# Patient Record
Sex: Female | Born: 1980 | Race: Black or African American | Hispanic: No | Marital: Single | Smoking: Former smoker
Health system: Southern US, Community
[De-identification: ages and names within clinical notes are randomized; demographics above are authoritative.]

## PROBLEM LIST (undated history)

## (undated) DIAGNOSIS — K5790 Diverticulosis of intestine, part unspecified, without perforation or abscess without bleeding: Secondary | ICD-10-CM

## (undated) DIAGNOSIS — O24419 Gestational diabetes mellitus in pregnancy, unspecified control: Secondary | ICD-10-CM

## (undated) DIAGNOSIS — Z8759 Personal history of other complications of pregnancy, childbirth and the puerperium: Secondary | ICD-10-CM

## (undated) HISTORY — DX: Gestational diabetes mellitus in pregnancy, unspecified control: O24.419

## (undated) HISTORY — PX: OTHER SURGICAL HISTORY: SHX169

## (undated) HISTORY — PX: CHOLECYSTECTOMY: SHX55

## (undated) HISTORY — DX: Diverticulosis of intestine, part unspecified, without perforation or abscess without bleeding: K57.90

## (undated) HISTORY — DX: Personal history of other complications of pregnancy, childbirth and the puerperium: Z87.59

---
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
## 2003-10-10 ENCOUNTER — Emergency Department (HOSPITAL_COMMUNITY): Admission: AD | Admit: 2003-10-10 | Discharge: 2003-10-10 | Payer: Self-pay | Admitting: Family Medicine

## 2004-12-14 ENCOUNTER — Emergency Department (HOSPITAL_COMMUNITY): Admission: EM | Admit: 2004-12-14 | Discharge: 2004-12-15 | Payer: Self-pay

## 2005-01-02 ENCOUNTER — Ambulatory Visit: Payer: Self-pay | Admitting: Internal Medicine

## 2005-10-08 ENCOUNTER — Ambulatory Visit: Payer: Self-pay | Admitting: Internal Medicine

## 2006-02-06 ENCOUNTER — Ambulatory Visit: Payer: Self-pay | Admitting: Internal Medicine

## 2006-03-09 ENCOUNTER — Inpatient Hospital Stay: Admission: AD | Admit: 2006-03-09 | Discharge: 2006-03-09 | Payer: Self-pay | Admitting: *Deleted

## 2006-05-16 ENCOUNTER — Ambulatory Visit (HOSPITAL_COMMUNITY): Admission: RE | Admit: 2006-05-16 | Discharge: 2006-05-16 | Payer: Self-pay | Admitting: Obstetrics

## 2006-05-18 ENCOUNTER — Emergency Department (HOSPITAL_COMMUNITY): Admission: EM | Admit: 2006-05-18 | Discharge: 2006-05-18 | Payer: Self-pay | Admitting: Family Medicine

## 2006-08-22 ENCOUNTER — Inpatient Hospital Stay (HOSPITAL_COMMUNITY): Admission: AD | Admit: 2006-08-22 | Discharge: 2006-08-24 | Payer: Self-pay | Admitting: Obstetrics & Gynecology

## 2006-08-26 ENCOUNTER — Inpatient Hospital Stay (HOSPITAL_COMMUNITY): Admission: AD | Admit: 2006-08-26 | Discharge: 2006-08-26 | Payer: Self-pay | Admitting: Obstetrics & Gynecology

## 2006-10-10 ENCOUNTER — Inpatient Hospital Stay (HOSPITAL_COMMUNITY): Admission: AD | Admit: 2006-10-10 | Discharge: 2006-10-13 | Payer: Self-pay | Admitting: Obstetrics

## 2006-10-11 ENCOUNTER — Encounter (INDEPENDENT_AMBULATORY_CARE_PROVIDER_SITE_OTHER): Payer: Self-pay | Admitting: Specialist

## 2007-09-23 ENCOUNTER — Ambulatory Visit (HOSPITAL_COMMUNITY): Admission: RE | Admit: 2007-09-23 | Discharge: 2007-09-23 | Payer: Self-pay | Admitting: Obstetrics & Gynecology

## 2007-12-09 ENCOUNTER — Inpatient Hospital Stay (HOSPITAL_COMMUNITY): Admission: AD | Admit: 2007-12-09 | Discharge: 2007-12-09 | Payer: Self-pay | Admitting: Obstetrics

## 2007-12-19 ENCOUNTER — Ambulatory Visit (HOSPITAL_COMMUNITY): Admission: RE | Admit: 2007-12-19 | Discharge: 2007-12-19 | Payer: Self-pay | Admitting: Obstetrics & Gynecology

## 2008-01-30 ENCOUNTER — Inpatient Hospital Stay (HOSPITAL_COMMUNITY): Admission: AD | Admit: 2008-01-30 | Discharge: 2008-01-30 | Payer: Self-pay | Admitting: Obstetrics & Gynecology

## 2008-02-16 ENCOUNTER — Inpatient Hospital Stay (HOSPITAL_COMMUNITY): Admission: AD | Admit: 2008-02-16 | Discharge: 2008-02-18 | Payer: Self-pay | Admitting: Obstetrics

## 2008-11-07 ENCOUNTER — Inpatient Hospital Stay (HOSPITAL_COMMUNITY): Admission: EM | Admit: 2008-11-07 | Discharge: 2008-11-10 | Payer: Self-pay | Admitting: Family Medicine

## 2008-11-09 ENCOUNTER — Encounter (INDEPENDENT_AMBULATORY_CARE_PROVIDER_SITE_OTHER): Payer: Self-pay | Admitting: General Surgery

## 2009-02-02 ENCOUNTER — Emergency Department (HOSPITAL_COMMUNITY): Admission: EM | Admit: 2009-02-02 | Discharge: 2009-02-02 | Payer: Self-pay | Admitting: Emergency Medicine

## 2010-05-07 ENCOUNTER — Emergency Department (HOSPITAL_COMMUNITY): Admission: EM | Admit: 2010-05-07 | Discharge: 2010-05-07 | Payer: Self-pay | Admitting: Emergency Medicine

## 2010-05-09 ENCOUNTER — Emergency Department (HOSPITAL_COMMUNITY): Admission: EM | Admit: 2010-05-09 | Discharge: 2010-05-09 | Payer: Self-pay | Admitting: Family Medicine

## 2010-09-20 ENCOUNTER — Emergency Department (HOSPITAL_COMMUNITY): Admission: EM | Admit: 2010-09-20 | Discharge: 2010-09-20 | Payer: Self-pay | Admitting: Emergency Medicine

## 2011-02-07 LAB — POCT PREGNANCY, URINE: Preg Test, Ur: NEGATIVE

## 2011-04-10 NOTE — Consult Note (Signed)
Diane, Snyder            ACCOUNT NO.:  [id number]   MEDICAL RECORD NO.:  [id number]          PATIENT TYPE:  INP   LOCATION:  5125                         FACILITY:  [facility]   PHYSICIAN:  James L. Randa Evens, M.D. DATE OF BIRTH:  1981/10/24   DATE OF CONSULTATION:  Snyder  DATE OF DISCHARGE:                                 CONSULTATION   We were asked to see Diane Snyder today in consultation for possibly  choledocholithiasis along with her pancreatitis.   HISTORY OF PRESENT ILLNESS:  This is a very pleasant 30 year old African  American female with a 86-month history of intermittent epigastric pain  that became severe yesterday and migrated to her left upper quadrant.  She describes no vomiting, no fever, occasional constipation.  She was  nauseated last night.  She required pain medications in the emergency  room but has had none since.  She tells me that she has 2 children, one  26-month-old and one 62-year-old at home.   Past medical history is significant only for hypertension with her  pregnancy.   She has no known drug allergies.   She is not on any medications at home.   Review of systems is significant for no fever, no headache, no recent  illness, no vomiting.   Social history is negative for drugs, alcohol, and tobacco.   On physical exam, she is alert and oriented, sitting up, bathing  herself.  Temperature is 98.3, pulse 82, respirations 20, blood pressure  is 106/59.  Heart has a regular rate and rhythm with no murmurs, rubs,  or gallops appreciated.  Her lungs are clear to auscultation.  Her  abdomen is soft, nontender, nondistended with good bowel sounds.   LABORATORY DATA:  Her hemoglobin has come down with hydration, it is  currently 11.3, hematocrit 34.4, white count 5.6, platelets 217,000.  Her BMET is within normal limits other than her potassium of 3.4.  LFTs  are as follows:  Today, AST 47, ALT 60, alk phos 104, total bilirubin  1.2; yesterday on  admission, her AST was 97, ALT 82, alk phos 105, total  bilirubin 2.8 today.  Today, her amylase is 688, lipase 586; yesterday,  her lipase was elevated at 2924.   Radiological exams done yesterday include an ultrasound of her abdomen.  Her gallbladder was contracted and full of stones.  Her common bile duct  was 3.5 mm in diameter within normal limits.   ASSESSMENT:  Dr. Carman Ching has seen and examined the patient,  collected her history and reviewed her chart.  His impression is that  this is a 30 year old female with gallstone pancreatitis.  No obvious  choledocholithiasis.  The patient was not requiring pain medications  overnight.  Her LFTs, amylase, and lipase have  trended down nicely.  No ERCP MRCP is planned at this time.  We will  follow with you.  Per [facility], the patient will possibly have a  cholecystectomy on November 09, 2008.   Thanks very much for this consultation.      Stephani Police, PA    ______________________________  Llana Aliment Randa Evens, M.D.  MLY/MEDQ  Diane Snyder  T:  Snyder  Job:  [id number]

## 2011-04-10 NOTE — Op Note (Signed)
[name], [name]            ACCOUNT NO.:  [id number]   MEDICAL RECORD NO.:  [id number]          PATIENT TYPE:  INP   LOCATION:  5125                         FACILITY:  [facility]   PHYSICIAN:  Anselm Pancoast. Weatherly, M.D.DATE OF BIRTH:  07-15-1981   DATE OF PROCEDURE:  11/09/2008  DATE OF DISCHARGE:                               OPERATIVE REPORT   PREOPERATIVE DIAGNOSIS:  Gallstones with a history of resolving  gallstone pancreatitis.   OPERATION:  Laparoscopic cholecystectomy with cholangiogram.   ANESTHESIA:  General anesthesia.   SURGEON:  Anselm Pancoast. Zachery Dakins, MD   ASSISTANT:  Dr. Rochel Brome. Tresa Endo.   HISTORY OF PRESENT ILLNESS:  Diane Snyder is a 30 year old female  who is about 6 months postpartum who was admitted through the emergency  room yesterday morning, earlier or late Sunday evening with pain in the  right upper quadrant.  The pain had presented and then gave her pain in  the left upper quadrant.  She has two children, two years of age and 71  months, and she is on birth control pills.  She was seen by the ER  physicians, and a gallbladder that was contracted full of stones was  noted.  Her common bile duct was slightly prominent at 3.5 mm, and her  amylase was significantly elevated up 2224.  She was admitted by Dr.  Janee Morn, placed on antibiotics.  We saw her the following morning, she  was clinically improved, and the amylase had come down to 500.  Her  amylase is down to normal this morning.  She is not having any  significant pain in the right or left upper quadrants, and I recommended  we proceed on with a laparoscopic cholecystectomy and cholangiogram and  she was in agreement.  She had been seen by Dr. Carman Ching of GI when  she had such a markedly amylase initially, and he was in agreement that  it is certainly seemed that she had passed the common duct stone.  The  patient is on Primaxin and no additional antibiotics were given.   DESCRIPTION FOR  PROCEDURE:  She was then taken to the OR suite, inducted  general anesthesia, endotracheal tube, oral tube into the stomach; and  the abdomen was prepped with Betadine solution and draped in a sterile  manner.  Her abdomen was fairly soft where she has been recently  pregnant, and a small incision was made below the umbilicus.  The fascia  identified, picked up between two Kocher's, and a small opening  carefully made into the peritoneal cavity.  The pursestring suture of 0  Vicryl was placed with Hasson cannula and the gallbladder was full of  stones, not acutely inflamed, but certainly not decompressed since there  were so many stones.   An upper 10-mm trocar was placed under direct vision after anesthetizing  the fascia and two lateral 5 mm trocars were placed.  I could see the  junction of the cystic duct of the gallbladder, and then I could see the  cystic artery.  I encompassed the cystic artery at right angle first,  placed one clip, then encompassed  the cystic bile duct at the  gallbladder junction, and placed a clip here.  I then freed up the  pneumoperitoneum over the more proximal cystic duct slightly, so I could  make a little opening.  Then, a Cook catheter was introduced, held in  place with a clip, and a cholangiogram was obtained.  It showed good  prompt filling of the extrahepatic biliary system, good flow into the  duodenum.  No evidence of a common duct stone.  It appears that it is  about 3 cm cystic duct.   I then removed the catheter strip.  I placed three clips about a  centimeter closer to the cystic bile duct junction and then divided the  cystic duct, then put two additional clips along the cystic artery and  divided it distal to the two clips.  Then, the gallbladder which was on  the mesentery was freed up, placed a clip across the area and there was  a posterior branch and then freed the gallbladder from its bed.  There  was good hemostasis.  The gallbladder  was placed in EndoCatch bag, and  then we looked down at the gallbladder fossa.  We were pleased with the  hemostasis.  No bile and no bleeding, and then switched the camera to  the upper 2 mm port and grabbed the sac containing the gallbladder and  it came out easily.   There were many stones in the gallbladder.  I then put an additional  figure-of-eight Vicryl in the fascia at the umbilicus.  A single Vicryl  suture of 0 at the subxiphoid area and then closed the subcutaneous  wounds with 4-0 Vicryl.  Benzoin and Steri-Strips on the skin after the  CO2 had been released.  The patient tolerated the procedure nicely.  We  will advance her to a regular diet over the next 12 hours, and should be  able to discharge in the morning.           ______________________________  Anselm Pancoast. Zachery Dakins, M.D.     WJW/MEDQ  D:  11/09/2008  T:  11/10/2008  Job:  [id number]

## 2011-04-10 NOTE — H&P (Signed)
Diane Snyder, GRIMLEY            ACCOUNT NO.:  [id number]   MEDICAL RECORD NO.:  [id number]          PATIENT TYPE:  INP   LOCATION:  5125                         FACILITY:  [facility]   PHYSICIAN:  Gabrielle Dare. Janee Morn, M.D.DATE OF BIRTH:  [birth year]   DATE OF ADMISSION:  11/07/2008  DATE OF DISCHARGE:                              HISTORY & PHYSICAL   CHIEF COMPLAINT:  Epigastric abdominal pain.   HISTORY OF PRESENT ILLNESS:  Ms. Pollet is a 30 year old African  American female who is otherwise healthy, who complains of a 43-month  history of intermittent epigastric abdominal pain.  It developed again  around 4 a.m. yesterday, and has gradually worsened.  It extended over  and towards the left upper quadrant and then down posteriorly.  She had  some associated nausea, but no significant vomiting.  She went to an  urgent care and was then evaluated at [facility] by  Dr. Freida Busman.   Laboratory studies were consistent with pancreatitis and mild  transaminitis and a hyperbilirubinemia.  Ultrasound demonstrates a  gallbladder, which is contracted and full of stones, bile duct was non-  dilated.  It is 3.5 sq .mm.  I have to evaluate for admission and  treatment.   PAST MEDICAL HISTORY:  Hypertension associated with pregnancy, which has  now resolved.  She has 2 children, one aged 2, and one 9 months.   CURRENT MEDICATIONS:  None.   ALLERGIES:  No known drug allergies.   FAMILY HISTORY:  She describes diabetes and hypertension in her parents.   REVIEW OF SYSTEMS:  GASTROINTESTINAL:  As above.  CONSTITUTIONAL:  Negative.  CARDIAC:  Negative.  PULMONARY:  Negative.  GENITOURINARY:  Negative.  MUSCULOSKELETAL:  Negative.  The remainder of the review of  systems was unremarkable.   PHYSICAL EXAMINATION:  VITAL SIGNS:  Today, temperature 97.6, pulse 76,  respirations 20, and blood pressure 113/64.  GENERAL:  She is awake and alert.  She appears her stated age.  She is  in no distress.  HEENT:  Pupils are equal and reactive.  Nares are patent and clear.  Oral mucosa is moist but she does have poor dentition.  NECK:  Supple.  No tenderness or masses.  PULMONARY:  Lungs are clear to auscultation.  No wheezing.  Respiratory  effort is good.  CARDIAC:  Heart is regular but no murmurs.  Pulse is palpable on the  left chest.  Distal pulses are 2+ throughout with no peripheral edema.  ABDOMEN:  Soft, but she does have some mild tenderness in the  epigastrium and extending over to the left upper quadrant.  Bowel sounds  are present.  No masses or organomegaly are felt on palpation.  SKIN:  Warm and dry.  No rashes are noted.  EXTREMITIES:  Demonstrates no deformity or tenderness.   Ultrasound results are as above.   LABORATORY STUDIES:  White blood cell count 8.6, hemoglobin 13.1,  platelets 233.  AST 97, ALT 82, alkaline phosphatase 105, bilirubin 2.8.  Sodium 138, potassium 3.7, chloride 106, CO2 of 24.  BUN 6, creatinine  0.66, glucose 121, lipase 2924.  IMPRESSION:  Biliary pancreatitis.   PLAN:  1. We will admit her and place her on IV fluids and IV antibiotics.  2. We will obtain Gastrointestinal consultation and I spoke with Dr.      Charlott Rakes from [facility].  3. We will also plan laparoscopic cholecystectomy once her      pancreatitis has resolved.  4. The plan was discussed in detail with the patient and questions      were answered.      Gabrielle Dare Janee Morn, M.D.  Electronically Signed     BET/MEDQ  D:  11/07/2008  T:  11/08/2008  Job:  [id number]   cc:   Shirley Friar, MD

## 2011-04-13 NOTE — Discharge Summary (Signed)
[name], [name]            ACCOUNT NO.:  [id number]   MEDICAL RECORD NO.:  [id number]          PATIENT TYPE:  INP   LOCATION:  5125                         FACILITY:  [facility]   PHYSICIAN:  Anselm Pancoast. Weatherly, M.D.DATE OF BIRTH:  23-Mar-1981   DATE OF ADMISSION:  11/07/2008  DATE OF DISCHARGE:  11/10/2008                               DISCHARGE SUMMARY   ADMITTING PHYSICIAN:  Gabrielle Dare. Janee Morn, MD   DISCHARGING PHYSICIAN:  Anselm Pancoast. Zachery Dakins, MD   CHIEF COMPLAINT AND REASON FOR ADMISSION:  Diane Snyder is a 30 year old  female patient, developed abdominal pain progressively over the past 2  months, increased by 4 a.m. on the day prior to admission, always  epigastric located.  This pain was reproducible in the ER.  The patient  on presentation had a white count of 8600, AST 97, ALT 82, alkaline  phosphatase 105, bilirubin 2.8, lipase 2924.  The patient had an  ultrasound done that revealed a gallbladder contracted and full of  stones.  Common bile duct nondilated at 3.5 mm.  The patient was  admitted with a diagnosis of biliary pancreatitis.   HOSPITAL COURSE:  The patient was admitted, placed on bowel rest, IV  fluids.  The patient had significant decrease in her LFTs as well as  pancreatic enzymes by the first hospital day with amylase down to 688,  lipase down to 506.  She continued with mild epigastric and left upper  quadrant tenderness.  Because of the suspicion for possible  choledocholithiasis in the setting of biliary pancreatitis and  transaminitis, GI consult was obtained.  There was no indication to have  an ERCP preoperatively and by November 09, 2008, the patient's lipase  was down to 82 and she was deemed appropriate to undergo surgery.  She  underwent a laparoscopic cholecystectomy with normal cholangiogram on  November 09, 2008 by Dr. Zachery Dakins.  By postop day #1, the patient was  doing well.  She had incisional tenderness, but was stable.  Bowel  sounds  were present.  She was tolerating liquid diet with advancement in  her pain medications to of oral.  Toradol was added and she was given  intake for discharge home.  Incisions were clean, dry and intact and she  was otherwise deemed appropriate discharge home.   FINAL DISCHARGE DIAGNOSES:  1. Abdominal pain secondary to biliary pancreatitis.  2. Status post laparoscopic cholecystectomy with normal intraoperative      cholangiogram.   DISCHARGE MEDICATIONS:  1. Vicodin 5/325 one to two tablets every 4 hours as needed for pain.  2. Ibuprofen 600 mg 3 times daily as needed for pain.  Always take      this with food or snacks.  May take in addition MiraLax.  Wound      care allow Steri-Strips to fall off.  Ice pack to wound.  To      prevent pain, remove bandages in the morning.  Return to work in 2      weeks note given.   DIET:  Low fat.   ACTIVITY:  Increase activity slowly.  May walk up steps.  No lifting  greater than 15 pounds for 2 weeks.  No driving for 1 week.   ADDITIONAL INSTRUCTIONS:  Call physician if fever greater than 101.5  degrees Fahrenheit, new or increased belly pain, redness, or drainage  from the wound, nausea, vomiting, diarrhea, or constipation.      Allison L. Rennis Harding, N.P.    ______________________________  Anselm Pancoast. Zachery Dakins, M.D.    ALE/MEDQ  D:  12/16/2008  T:  12/17/2008  Job:  [id number]

## 2011-04-13 NOTE — Discharge Summary (Signed)
Diane Snyder, Diane Snyder            ACCOUNT NO.:  [id number]   MEDICAL RECORD NO.:  [id number]          PATIENT TYPE:  INP   LOCATION:  9153                          FACILITY:  [facility]   PHYSICIAN:  Charles A. Clearance Coots, M.D.DATE OF BIRTH:  1981-08-19   DATE OF ADMISSION:  08/22/2006  DATE OF DISCHARGE:  08/24/2006                                 DISCHARGE SUMMARY   ADMITTING DIAGNOSES:  1. Thirty-one weeks' gestation.  2. Preterm uterine contractions.  3. Probable cervicitis.   DISCHARGE DIAGNOSES:  1. Thirty-one weeks' gestation, much improved after bed rest and      intravenous antibiotic therapy.  2. Preterm uterine contractions.  3. Probable cervicitis.   DISPOSITION:  Discharged home, undelivered, at 64 weeks' gestation in good  condition.   REASON FOR ADMISSION:  A 30 year old, black female, G1 presented with  complaint of spotting.  She said it felt like the baby was balling up.  She  was examined and found to have findings significant for probable cervicitis.  Cervix is closed, but was 50% effaced.  The patient was therefore admitted  for IV antibiotic therapy and fetal monitor.   PAST MEDICAL HISTORY:  Surgery none.  Illnesses none.   MEDICATIONS:  Prenatal vitamins   ALLERGIES:  NO KNOWN DRUG ALLERGIES.   SOCIAL HISTORY:  Single.  Negative for tobacco, alcohol or recreational drug  use.   FAMILY HISTORY:  Positive for arthritis, asthma, COPD, diabetes, glaucoma  and hypertension.   PHYSICAL EXAMINATION:  GENERAL:  Well-nourished, well-developed female in no  acute distress.  VITAL SIGNS:  Temperature 98.3, pulse 105, respiratory rate 20, blood  pressure 120/77.  HEENT:  Within normal limits.  LUNGS:  Clear to auscultation bilaterally.  HEART:  Regular rate and rhythm.  ABDOMEN:  Gravid, nontender.  PELVIS:  On speculum exam, there was a small amount of heme in the vaginal  vault.  No active bleeding from the cervix.   LABORATORY DATA AND X-RAY FINDINGS:   Wet-prep revealed a few yeast, a few  clue cells, a few white blood cells.  Urinalysis revealed specific gravity  of 1.015, large heme and small  leukocyte esterase.  Hemoglobin 10.9,  hematocrit 32.5, white blood cell count 9600, platelets 214,000.  GC and  chlamydia cultures were negative.   HOSPITAL COURSE:  The patient was admitted and started on IV fluid hydration  and IV antibiotic therapy.  She responded well to therapy and initially had  increased uterine activity and was given subcu terbutaline with good  response.  She had no further uterine activity and was discharged home on  hospital day #2, undelivered at 41 weeks' gestation in good condition.   DISCHARGE MEDICATIONS:  1. Clindamycin 300 mg p.o. twice a day.  2. Continue prenatal vitamins.   SPECIAL INSTRUCTIONS:  Routine written instructions were given for discharge  and preterm labor precautions.  The patient is to follow up in the office in  1 week.      Charles A. Clearance Coots, M.D.  Electronically Signed     CAH/MEDQ  D:  08/24/2006  T:  08/26/2006  Job:  [id number]

## 2011-07-24 ENCOUNTER — Inpatient Hospital Stay (INDEPENDENT_AMBULATORY_CARE_PROVIDER_SITE_OTHER)
Admission: RE | Admit: 2011-07-24 | Discharge: 2011-07-24 | Disposition: A | Discharge status: Home / Self Care | Payer: Medicaid Other | Source: Ambulatory Visit | Attending: Emergency Medicine | Admitting: Emergency Medicine

## 2011-07-24 DIAGNOSIS — G43009 Migraine without aura, not intractable, without status migrainosus: Secondary | ICD-10-CM

## 2011-08-16 LAB — URINALYSIS, ROUTINE W REFLEX MICROSCOPIC
Hgb urine dipstick: NEGATIVE
Ketones, ur: NEGATIVE
Nitrite: NEGATIVE
Protein, ur: NEGATIVE
pH: 6.5

## 2011-08-16 LAB — URINE MICROSCOPIC-ADD ON

## 2011-08-20 LAB — URINALYSIS, ROUTINE W REFLEX MICROSCOPIC
Bilirubin Urine: NEGATIVE
Hgb urine dipstick: NEGATIVE
Ketones, ur: NEGATIVE
Nitrite: NEGATIVE
Protein, ur: NEGATIVE
Urobilinogen, UA: 0.2

## 2011-08-20 LAB — URINE MICROSCOPIC-ADD ON

## 2011-08-20 LAB — CBC
HCT: 37.8
MCHC: 32.8
MCHC: 33.5
MCV: 81.3
Platelets: 168
RBC: 3.86 — ABNORMAL LOW
RBC: 4.61
RDW: 15.1

## 2011-08-20 LAB — WET PREP, GENITAL: Yeast Wet Prep HPF POC: NONE SEEN

## 2011-08-31 LAB — COMPREHENSIVE METABOLIC PANEL
ALT: 82 U/L — ABNORMAL HIGH (ref 0–35)
Albumin: 2.9 g/dL — ABNORMAL LOW (ref 3.5–5.2)
Albumin: 3 g/dL — ABNORMAL LOW (ref 3.5–5.2)
Albumin: 3.8 g/dL (ref 3.5–5.2)
Alkaline Phosphatase: 104 U/L (ref 39–117)
Alkaline Phosphatase: 105 U/L (ref 39–117)
Alkaline Phosphatase: 90 U/L (ref 39–117)
BUN: 3 mg/dL — ABNORMAL LOW (ref 6–23)
BUN: 6 mg/dL (ref 6–23)
CO2: 25 mEq/L (ref 19–32)
Calcium: 8.4 mg/dL (ref 8.4–10.5)
Chloride: 108 mEq/L (ref 96–112)
Creatinine, Ser: 0.66 mg/dL (ref 0.4–1.2)
Creatinine, Ser: 0.66 mg/dL (ref 0.4–1.2)
Creatinine, Ser: 0.71 mg/dL (ref 0.4–1.2)
GFR calc Af Amer: >60 mL/min (ref >60)
GFR calc non Af Amer: >60 mL/min (ref >60)
GFR calc non Af Amer: >60 mL/min (ref >60)
Glucose, Bld: 75 mg/dL (ref 70–99)
Potassium: 3.4 mEq/L — ABNORMAL LOW (ref 3.5–5.1)
Potassium: 3.7 mEq/L (ref 3.5–5.1)
Potassium: 3.7 mEq/L (ref 3.5–5.1)
Sodium: 138 mEq/L (ref 135–145)
Total Bilirubin: 0.8 mg/dL (ref 0.3–1.2)
Total Protein: 5.9 g/dL — ABNORMAL LOW (ref 6.0–8.3)
Total Protein: 7.5 g/dL (ref 6.0–8.3)

## 2011-08-31 LAB — CBC
HCT: 33.9 % — ABNORMAL LOW (ref 36.0–46.0)
HCT: 34.4 % — ABNORMAL LOW (ref 36.0–46.0)
Hemoglobin: 11.3 g/dL — ABNORMAL LOW (ref 12.0–15.0)
Hemoglobin: 11.4 g/dL — ABNORMAL LOW (ref 12.0–15.0)
MCHC: 31.9 g/dL (ref 30.0–36.0)
MCHC: 33 g/dL (ref 30.0–36.0)
MCV: 78.9 fL (ref 78.0–100.0)
MCV: 79.8 fL (ref 78.0–100.0)
MCV: 80 fL (ref 78.0–100.0)
Platelets: 217 10*3/uL (ref 150–400)
Platelets: 222 10*3/uL (ref 150–400)
Platelets: 233 10*3/uL (ref 150–400)
RBC: 4.29 MIL/uL (ref 3.87–5.11)
RDW: 13.8 % (ref 11.5–15.5)
WBC: 5 10*3/uL (ref 4.0–10.5)
WBC: 8.6 10*3/uL (ref 4.0–10.5)

## 2011-08-31 LAB — URINALYSIS, ROUTINE W REFLEX MICROSCOPIC
Glucose, UA: NEGATIVE mg/dL
Hgb urine dipstick: NEGATIVE
Nitrite: NEGATIVE
Protein, ur: 30 mg/dL — AB
Specific Gravity, Urine: 1.027 (ref 1.005–1.030)

## 2011-08-31 LAB — LIPASE, BLOOD: Lipase: 586 U/L — ABNORMAL HIGH (ref 11–59)

## 2011-08-31 LAB — DIFFERENTIAL
Band Neutrophils: 0 % (ref 0–10)
Basophils Absolute: 0 10*3/uL (ref 0.0–0.1)
Blasts: 0 %
Eosinophils Relative: 2 % (ref 0–5)
Lymphocytes Relative: 17 % (ref 12–46)
Lymphs Abs: 1.5 10*3/uL (ref 0.7–4.0)
Metamyelocytes Relative: 0 %
Monocytes Absolute: 0.3 10*3/uL (ref 0.1–1.0)
Neutrophils Relative %: 78 % — ABNORMAL HIGH (ref 43–77)
Promyelocytes Absolute: 0 %
nRBC: 0 /100 WBC

## 2011-08-31 LAB — URINE MICROSCOPIC-ADD ON

## 2011-08-31 LAB — URINE CULTURE: Colony Count: 6000

## 2011-08-31 LAB — AMYLASE: Amylase: 688 U/L — ABNORMAL HIGH (ref 27–131)

## 2014-04-12 ENCOUNTER — Ambulatory Visit: Payer: 59 | Admitting: Obstetrics

## 2014-05-25 ENCOUNTER — Encounter: Payer: Self-pay | Admitting: Obstetrics

## 2014-05-25 ENCOUNTER — Ambulatory Visit (INDEPENDENT_AMBULATORY_CARE_PROVIDER_SITE_OTHER): Payer: 59 | Admitting: Obstetrics

## 2014-05-25 VITALS — BP 122/77 | HR 88 | Temp 98.7°F | Ht 66.0 in | Wt 191.0 lb

## 2014-05-25 DIAGNOSIS — Z3202 Encounter for pregnancy test, result negative: Secondary | ICD-10-CM

## 2014-05-25 DIAGNOSIS — Z30431 Encounter for routine checking of intrauterine contraceptive device: Secondary | ICD-10-CM

## 2014-05-25 DIAGNOSIS — Z30433 Encounter for removal and reinsertion of intrauterine contraceptive device: Secondary | ICD-10-CM

## 2014-05-26 ENCOUNTER — Encounter: Payer: Self-pay | Admitting: Obstetrics

## 2014-05-26 LAB — GC/CHLAMYDIA PROBE AMP
CT PROBE, AMP APTIMA: NEGATIVE
GC Probe RNA: NEGATIVE

## 2014-05-26 LAB — WET PREP BY MOLECULAR PROBE
Candida species: POSITIVE — AB
GARDNERELLA VAGINALIS: NEGATIVE
TRICHOMONAS VAG: NEGATIVE

## 2014-05-26 NOTE — Progress Notes (Signed)
IUD Insertion Procedure Note  Pre-operative Diagnosis: Desire Contraception  Post-operative Diagnosis: same  Indications: contraception  Procedure Details  Urine pregnancy test was done in office and result was negative.  The risks (including infection, bleeding, pain, and uterine perforation) and benefits of the procedure were explained to the patient and Written informed consent was obtained.    Cervix cleansed with Betadine. Uterus sounded to 6 cm. IUD inserted without difficulty. String visible and trimmed. Patient tolerated procedure well.  IUD Information: Mirena, Lot # [id number], Expiration date 08 / 17.  Condition: Stable  Complications: None  Plan:  The patient was advised to call for any fever or for prolonged or severe pain or bleeding. She was advised to use NSAID as needed for mild to moderate pain.   Attending Physician Documentation: I was present for or participated in the entire procedure, including opening and closing.

## 2014-05-26 NOTE — Progress Notes (Signed)
Subjective:    Diane Snyder is a 33 y.o. female who presents for contraception counseling. The patient has no complaints today. The patient is sexually active. Pertinent past medical history: none.  The information documented in the HPI was reviewed and verified.  Menstrual History: OB History   Grav Para Term Preterm Abortions TAB SAB Ect Mult Living   2 2 2       2       No LMP recorded. Patient is not currently having periods (Reason: IUD).   There are no active problems to display for this patient.  History reviewed. No pertinent past medical history.  Past Surgical History  Procedure Laterality Date  . Cholestectomy       No current outpatient prescriptions on file. No Known Allergies  History  Substance Use Topics  . Smoking status: Former Research scientist (life sciences)  . Smokeless tobacco: Never Used  . Alcohol Use: Yes    Family History  Problem Relation Age of Onset  . Hypertension Mother        Review of Systems Constitutional: negative for weight loss Genitourinary:negative for abnormal menstrual periods and vaginal discharge   Objective:   BP 122/77  Pulse 88  Temp(Src) 98.7 F (37.1 C)  Ht 5\' 6"  (1.676 m)  Wt 191 lb (86.637 kg)  BMI 30.84 kg/m2   General:   alert  Skin:   IUD string   no rash or abnormalities  Lungs:   clear to auscultation bilaterally  Heart:   regular rate and rhythm, S1, S2 normal, no murmur, click, rub or gallop  Breasts:   normal without suspicious masses, skin or nipple changes or axillary nodes  Abdomen:  normal findings: no organomegaly, soft, non-tender and no hernia  Pelvis:  External genitalia: normal general appearance Urinary system: urethral meatus normal and bladder without fullness, nontender Vaginal: normal without tenderness, induration or masses Cervix: normal appearance.  IUD string visible and grasped with dressing forcep and IUD removed, intact. Adnexa: normal bimanual exam Uterus: anteverted and non-tender, normal size    Lab Review Urine pregnancy test is negative Labs reviewed yes Radiologic studies reviewed no    Assessment:    33 y.o., continuing IUD, no contraindications.   Plan:    All questions answered. Discussed healthy lifestyle modifications. Follow up in 6 weeks. Pregnancy test, result: negative. Wet prep. GC / [place] cultures done  No orders of the defined types were placed in this encounter.   Orders Placed This Encounter  Procedures  . WET PREP BY MOLECULAR PROBE  . GC/Chlamydia Probe Amp

## 2014-05-31 LAB — POCT URINE PREGNANCY: Preg Test, Ur: NEGATIVE

## 2014-05-31 NOTE — Addendum Note (Signed)
Addended by: Ladona Ridgel on: 05/31/2014 10:27 AM   Modules accepted: Orders

## 2014-06-11 ENCOUNTER — Other Ambulatory Visit: Payer: Self-pay | Admitting: *Deleted

## 2014-06-11 DIAGNOSIS — B373 Candidiasis of vulva and vagina: Secondary | ICD-10-CM

## 2014-06-11 DIAGNOSIS — B3731 Acute candidiasis of vulva and vagina: Secondary | ICD-10-CM

## 2014-06-11 MED ORDER — FLUCONAZOLE 150 MG PO TABS: 150.0000 mg | ORAL_TABLET | Freq: Every day | ORAL | Status: DC

## 2014-07-06 ENCOUNTER — Ambulatory Visit: Payer: 59 | Admitting: Obstetrics

## 2014-09-27 ENCOUNTER — Encounter: Payer: Self-pay | Admitting: Obstetrics

## 2016-05-08 ENCOUNTER — Encounter (HOSPITAL_COMMUNITY): Payer: Self-pay | Admitting: Emergency Medicine

## 2016-05-08 ENCOUNTER — Ambulatory Visit (INDEPENDENT_AMBULATORY_CARE_PROVIDER_SITE_OTHER): Payer: BC Managed Care – PPO

## 2016-05-08 ENCOUNTER — Ambulatory Visit (HOSPITAL_COMMUNITY)
Admission: EM | Admit: 2016-05-08 | Discharge: 2016-05-08 | Disposition: A | Discharge status: Home / Self Care | Payer: BC Managed Care – PPO | Attending: Family Medicine | Admitting: Family Medicine

## 2016-05-08 ENCOUNTER — Ambulatory Visit (HOSPITAL_COMMUNITY): Payer: BC Managed Care – PPO

## 2016-05-08 DIAGNOSIS — M940 Chondrocostal junction syndrome [Tietze]: Secondary | ICD-10-CM | POA: Insufficient documentation

## 2016-05-08 DIAGNOSIS — Z87891 Personal history of nicotine dependence: Secondary | ICD-10-CM | POA: Diagnosis not present

## 2016-05-08 DIAGNOSIS — R079 Chest pain, unspecified: Secondary | ICD-10-CM | POA: Diagnosis present

## 2016-05-08 MED ORDER — NAPROXEN SODIUM 550 MG PO TABS: 550.0000 mg | ORAL_TABLET | Freq: Two times a day (BID) | ORAL | Status: DC

## 2016-05-08 NOTE — Discharge Instructions (Signed)
Costochondritis  Costochondritis is a condition in which the tissue (cartilage) that connects your ribs with your breastbone (sternum) becomes irritated. It causes pain in the chest and rib area. It usually goes away on its own over time.  HOME CARE  · Avoid activities that wear you out.  · Do not strain your ribs. Avoid activities that use your:    Chest.    Belly.    Side muscles.  · Put ice on the area for the first 2 days after the pain starts.    Put ice in a plastic bag.    Place a towel between your skin and the bag.    Leave the ice on for 20 minutes, 2-3 times a day.  · Only take medicine as told by your doctor.  GET HELP IF:  · You have redness or puffiness (swelling) in the rib area.  · Your pain does not go away with rest or medicine.  GET HELP RIGHT AWAY IF:   · Your pain gets worse.  · You are very uncomfortable.  · You have trouble breathing.  · You cough up blood.  · You start sweating or throwing up (vomiting).  · You have a fever or lasting symptoms for more than 2-3 days.  · You have a fever and your symptoms suddenly get worse.  MAKE SURE YOU:   · Understand these instructions.  · Will watch your condition.  · Will get help right away if you are not doing well or get worse.     This information is not intended to replace advice given to you by your health care provider. Make sure you discuss any questions you have with your health care provider.     Document Released: 04/30/2008 Document Revised: 07/15/2013 Document Reviewed: 06/16/2013  Elsevier Interactive Patient Education ©2016 [employer]

## 2016-05-08 NOTE — ED Notes (Signed)
Patient going to radiology at [facility].  Spoke to [place] in radiology making them aware of patient coming

## 2016-05-08 NOTE — ED Provider Notes (Signed)
CSN: [id number]     Arrival date & time 05/08/16  1435 History   First MD Initiated Contact with Patient 05/08/16 1537     Chief Complaint  Patient presents with  . Chest Pain   (Consider location/radiation/quality/duration/timing/severity/associated sxs/prior Treatment) HPI History obtained from patient: Location: midchest  Context/Duration: 1 week, sudden onset of pain with movement and deep breathing.   Severity: 2  Quality:sharp shooting Timing:           episodic Home Treatment: tylenol, rest Associated symptoms:  Some shortness of breath if carrying heavy bags of trash Family History:HTN-mother    History reviewed. No pertinent past medical history. Past Surgical History  Procedure Laterality Date  . Cholestectomy     . Cholecystectomy     Family History  Problem Relation Age of Onset  . Hypertension Mother    Social History  Substance Use Topics  . Smoking status: Former Research scientist (life sciences)  . Smokeless tobacco: Never Used  . Alcohol Use: Yes   OB History    Gravida Para Term Preterm AB TAB SAB Ectopic Multiple Living   2 2 2       2      Review of Systems  Denies: HEADACHE, NAUSEA, ABDOMINAL PAIN,  CONGESTION, DYSURIA, SHORTNESS OF BREATH  Allergies  Review of patient's allergies indicates no known allergies.  Home Medications   Prior to Admission medications   Medication Sig Start Date End Date Taking? Authorizing Provider  ibuprofen (ADVIL,MOTRIN) 200 MG tablet Take 200 mg by mouth every 6 (six) hours as needed.   Yes Historical Provider, MD  fluconazole (DIFLUCAN) 150 MG tablet Take 1 tablet (150 mg total) by mouth daily. Patient not taking: Reported on 05/08/2016 06/11/14   Lahoma Crocker, MD   Meds Ordered and Administered this Visit  Medications - No data to display  BP 133/75 mmHg  Pulse 92  Temp(Src) 98.1 F (36.7 C) (Oral)  Resp 16  SpO2 100% No data found.   Physical Exam NURSES NOTES AND VITAL SIGNS REVIEWED. CONSTITUTIONAL: Well developed,  well nourished, no acute distress HEENT: normocephalic, atraumatic EYES: Conjunctiva normal NECK:normal ROM, supple, no adenopathy PULMONARY:No respiratory distress, normal effort Chest Wall: tender to palpation, reproducible.  ABDOMINAL: Soft, ND, NT BS+, No CVAT MUSCULOSKELETAL: Normal ROM of all extremities,  SKIN: warm and dry without rash PSYCHIATRIC: Mood and affect, behavior are normal   ED Course  Procedures (including critical care time)  Labs Review Labs Reviewed - No data to display  Imaging Review Dg Chest 2 View  05/08/2016  CLINICAL DATA:  Intermittent CP X one week, worse upon a deep breath or when lying down; No known heart or respiratory problems EXAM: CHEST  2 VIEW COMPARISON:  None. FINDINGS: The heart size and mediastinal contours are within normal limits. Both lungs are clear. No pleural effusion or pneumothorax. The visualized skeletal structures are unremarkable. IMPRESSION: Normal chest radiographs. Electronically Signed   By: Lajean Manes M.D.   On: 05/08/2016 17:39    Films and report reviewed and used as part of the MDM.  Visual Acuity Review  Right Eye Distance:   Left Eye Distance:   Bilateral Distance:    Right Eye Near:   Left Eye Near:    Bilateral Near:       RX for anaprox Expect full recovery from this self-limiting illness. Return to school note is provided.  MDM   1. Acute costochondritis     Patient is reassured that there are no issues that  require transfer to higher level of care at this time or additional tests. Patient is advised to continue home symptomatic treatment. Patient is advised that if there are new or worsening symptoms to attend the emergency department, contact primary care provider, or return to UC. Instructions of care provided discharged home in stable condition.    THIS NOTE WAS GENERATED USING A VOICE RECOGNITION SOFTWARE PROGRAM. ALL REASONABLE EFFORTS  WERE MADE TO PROOFREAD THIS DOCUMENT FOR  ACCURACY.  I have verbally reviewed the discharge instructions with the patient. A printed AVS was given to the patient.  All questions were answered prior to discharge.      Konrad Felix, PA 05/08/16 Vernelle Emerald

## 2016-05-08 NOTE — ED Notes (Signed)
Chest pain for a week, intermittent.  Pain is worse with lying down.  Pain does happen with sitting, but not as often or as bad as when lying down.  Patient does not think eating plays a factor in this pain.  Pain is center chest.  Pain is not clearly sharp or dull.

## 2017-12-03 ENCOUNTER — Encounter: Payer: Self-pay | Admitting: Emergency Medicine

## 2017-12-03 ENCOUNTER — Other Ambulatory Visit: Payer: Self-pay

## 2017-12-03 ENCOUNTER — Ambulatory Visit (INDEPENDENT_AMBULATORY_CARE_PROVIDER_SITE_OTHER): Payer: 59 | Admitting: Emergency Medicine

## 2017-12-03 VITALS — BP 104/64 | HR 94 | Temp 98.2°F | Resp 16 | Ht 66.5 in | Wt 200.4 lb

## 2017-12-03 DIAGNOSIS — R079 Chest pain, unspecified: Secondary | ICD-10-CM | POA: Diagnosis not present

## 2017-12-03 NOTE — Patient Instructions (Addendum)
   IF you received an x-ray today, you will receive an invoice from [facility]. Please contact [facility] at [phone] with questions or concerns regarding your invoice.   IF you received labwork today, you will receive an invoice from [facility]. Please contact [facility] at [phone] with questions or concerns regarding your invoice.   Our billing staff will not be able to assist you with questions regarding bills from these companies.  You will be contacted with the lab results as soon as they are available. The fastest way to get your results is to activate your My Chart account. Instructions are located on the last page of this paperwork. If you have not heard from us regarding the results in 2 weeks, please contact this office.     Nonspecific Chest Pain Chest pain can be caused by many different conditions. There is a chance that your pain could be related to something serious, such as a heart attack or a blood clot in your lungs. Chest pain can also be caused by conditions that are not life-threatening. If you have chest pain, it is very important to follow up with your doctor. Follow these instructions at home: Medicines  If you were prescribed an antibiotic medicine, take it as told by your doctor. Do not stop taking the antibiotic even if you start to feel better.  Take over-the-counter and prescription medicines only as told by your doctor. Lifestyle  Do not use any products that contain nicotine or tobacco, such as cigarettes and e-cigarettes. If you need help quitting, ask your doctor.  Do not drink alcohol.  Make lifestyle changes as told by your doctor. These may include: ? Getting regular exercise. Ask your doctor for some activities that are safe for you. ? Eating a heart-healthy diet. A diet specialist (dietitian) can help you to learn healthy eating options. ? Staying at a healthy weight. ? Managing diabetes, if needed. ? Lowering your  stress, as with deep breathing or spending time in nature. General instructions  Avoid any activities that make you feel chest pain.  If your chest pain is because of heartburn: ? Raise (elevate) the head of your bed about 6 inches (15 cm). You can do this by putting blocks under the bed legs at the head of the bed. ? Do not sleep with extra pillows under your head. That does not help heartburn.  Keep all follow-up visits as told by your doctor. This is important. This includes any further testing if your chest pain does not go away. Contact a doctor if:  Your chest pain does not go away.  You have a rash with blisters on your chest.  You have a fever.  You have chills. Get help right away if:  Your chest pain is worse.  You have a cough that gets worse, or you cough up blood.  You have very bad (severe) pain in your belly (abdomen).  You are very weak.  You pass out (faint).  You have either of these for no clear reason: ? Sudden chest discomfort. ? Sudden discomfort in your arms, back, neck, or jaw.  You have shortness of breath at any time.  You suddenly start to sweat, or your skin gets clammy.  You feel sick to your stomach (nauseous).  You throw up (vomit).  You suddenly feel light-headed or dizzy.  Your heart starts to beat fast, or it feels like it is skipping beats. These symptoms may be an emergency. Do not wait   to see if the symptoms will go away. Get medical help right away. Call your local emergency services (911 in the [place] Do not drive yourself to the hospital. This information is not intended to replace advice given to you by your health care provider. Make sure you discuss any questions you have with your health care provider. Document Released: 04/30/2008 Document Revised: 08/06/2016 Document Reviewed: 08/06/2016 Elsevier Interactive Patient Education  2017 Elsevier Inc.  

## 2017-12-03 NOTE — Progress Notes (Signed)
Diane Snyder 37 y.o.   Chief Complaint  Patient presents with  . Dizziness    x 2 days per patient when lying on RIGHT side and chest pressure  . Establish Care    HISTORY OF PRESENT ILLNESS: This is a 37 y.o. female complaining of brief episode of burning lower sternal pain 2 days ago that happened at rest and lasted about 10 minutes; went away spontaneously and was not associated with any other symptomatology. Next day felt a little dizzy in am but went away after a few minutes. Asymptomatic now.  HPI   Prior to Admission medications   Medication Sig Start Date End Date Taking? Authorizing Provider  Acetaminophen (TYLENOL PO) Take by mouth as needed.   Yes [provider]  ibuprofen (ADVIL,MOTRIN) 200 MG tablet Take 200 mg by mouth every 6 (six) hours as needed.    [provider]  naproxen sodium (ANAPROX DS) 550 MG tablet Take 1 tablet (550 mg total) by mouth 2 (two) times daily with a meal. Patient not taking: Reported on 12/03/2017 05/08/16   Konrad Felix, PA    No Known Allergies  There are no active problems to display for this patient.   No past medical history on file.  Past Surgical History:  Procedure Laterality Date  . CHOLECYSTECTOMY    . cholestectomy       Social History   Socioeconomic History  . Marital status: Single    Spouse name: Not on file  . Number of children: Not on file  . Years of education: Not on file  . Highest education level: Not on file  Social Needs  . Financial resource strain: Not on file  . Food insecurity - worry: Not on file  . Food insecurity - inability: Not on file  . Transportation needs - medical: Not on file  . Transportation needs - non-medical: Not on file  Occupational History  . Not on file  Tobacco Use  . Smoking status: Former Research scientist (life sciences)  . Smokeless tobacco: Never Used  Substance and Sexual Activity  . Alcohol use: Yes  . Drug use: No  . Sexual activity: Yes    Birth  control/protection: IUD  Other Topics Concern  . Not on file  Social History Narrative  . Not on file    Family History  Problem Relation Age of Onset  . Hypertension Mother   . Heart disease Maternal Grandmother   . Hypertension Maternal Grandmother   . Diabetes Maternal Grandmother   . Stroke Maternal Grandfather   . Heart disease Maternal Grandfather   . Heart attack Maternal Grandfather      Review of Systems  Constitutional: Negative.  Negative for chills, fever, malaise/fatigue and weight loss.  HENT: Negative.  Negative for congestion, nosebleeds and sore throat.   Eyes: Negative.  Negative for discharge and redness.  Respiratory: Negative.  Negative for cough, hemoptysis and shortness of breath.   Cardiovascular: Positive for chest pain. Negative for palpitations, orthopnea, claudication and leg swelling.  Gastrointestinal: Negative for abdominal pain, blood in stool, diarrhea, nausea and vomiting.  Genitourinary: Negative.  Negative for dysuria and hematuria.  Musculoskeletal: Negative.  Negative for back pain, myalgias and neck pain.  Skin: Negative.  Negative for rash.  Neurological: Positive for dizziness. Negative for sensory change, focal weakness and headaches.  Endo/Heme/Allergies: Negative.   All other systems reviewed and are negative.   Vitals:   12/03/17 1030  BP: 104/64  Pulse: 94  Resp: 16  Temp: 98.2 F (36.8 C)  SpO2: 99%    Physical Exam  Constitutional: She is oriented to person, place, and time. She appears well-developed and well-nourished.  HENT:  Head: Normocephalic and atraumatic.  Nose: Nose normal.  Mouth/Throat: Oropharynx is clear and moist.  Eyes: Conjunctivae and EOM are normal. Pupils are equal, round, and reactive to light.  Neck: Normal range of motion. Neck supple. No JVD present. No thyromegaly present.  Cardiovascular: Normal rate, regular rhythm and normal heart sounds.  Pulmonary/Chest: Effort normal and breath sounds  normal.  Abdominal: Soft. Bowel sounds are normal. She exhibits no distension. There is no tenderness.  Musculoskeletal: Normal range of motion. She exhibits no edema.  Lymphadenopathy:    She has no cervical adenopathy.  Neurological: She is alert and oriented to person, place, and time. No sensory deficit. She exhibits normal muscle tone.  Skin: Skin is warm and dry. Capillary refill takes less than 2 seconds. No rash noted.  Psychiatric: She has a normal mood and affect. Her behavior is normal.  Vitals reviewed.  A total of 30 minutes was spent in the room with the patient, greater than 50% of which was in counseling/coordination of care regarding present condition and DDx.  NSR with VR73 No acute ischemic changes PR134 QRS82.  ASSESSMENT & PLAN: [name] was seen today for dizziness and establish care.  Diagnoses and all orders for this visit:  Chest pain, unspecified type -     CBC with Differential/Platelet -     Comprehensive metabolic panel -     EKG 69-GEXB    Patient Instructions       IF you received an x-ray today, you will receive an invoice from [facility]. Please contact [facility] at [phone] with questions or concerns regarding your invoice.   IF you received labwork today, you will receive an invoice from [facility]. Please contact [facility] at [phone] with questions or concerns regarding your invoice.   Our billing staff will not be able to assist you with questions regarding bills from these companies.  You will be contacted with the lab results as soon as they are available. The fastest way to get your results is to activate your My Chart account. Instructions are located on the last page of this paperwork. If you have not heard from [place] regarding the results in 2 weeks, please contact this office.     Nonspecific Chest Pain Chest pain can be caused by many different conditions. There is a chance that your pain could be related  to something serious, such as a heart attack or a blood clot in your lungs. Chest pain can also be caused by conditions that are not life-threatening. If you have chest pain, it is very important to follow up with your doctor. Follow these instructions at home: Medicines  If you were prescribed an antibiotic medicine, take it as told by your doctor. Do not stop taking the antibiotic even if you start to feel better.  Take over-the-counter and prescription medicines only as told by your doctor. Lifestyle  Do not use any products that contain nicotine or tobacco, such as cigarettes and e-cigarettes. If you need help quitting, ask your doctor.  Do not drink alcohol.  Make lifestyle changes as told by your doctor. These may include: ? Getting regular exercise. Ask your doctor for some activities that are safe for you. ? Eating a heart-healthy diet. A diet specialist (dietitian) can help you to learn healthy eating options. ? Staying  at a healthy weight. ? Managing diabetes, if needed. ? Lowering your stress, as with deep breathing or spending time in nature. General instructions  Avoid any activities that make you feel chest pain.  If your chest pain is because of heartburn: ? Raise (elevate) the head of your bed about 6 inches (15 cm). You can do this by putting blocks under the bed legs at the head of the bed. ? Do not sleep with extra pillows under your head. That does not help heartburn.  Keep all follow-up visits as told by your doctor. This is important. This includes any further testing if your chest pain does not go away. Contact a doctor if:  Your chest pain does not go away.  You have a rash with blisters on your chest.  You have a fever.  You have chills. Get help right away if:  Your chest pain is worse.  You have a cough that gets worse, or you cough up blood.  You have very bad (severe) pain in your belly (abdomen).  You are very weak.  You pass out  (faint).  You have either of these for no clear reason: ? Sudden chest discomfort. ? Sudden discomfort in your arms, back, neck, or jaw.  You have shortness of breath at any time.  You suddenly start to sweat, or your skin gets clammy.  You feel sick to your stomach (nauseous).  You throw up (vomit).  You suddenly feel light-headed or dizzy.  Your heart starts to beat fast, or it feels like it is skipping beats. These symptoms may be an emergency. Do not wait to see if the symptoms will go away. Get medical help right away. Call your local emergency services (911 in the [place] Do not drive yourself to the hospital. This information is not intended to replace advice given to you by your health care provider. Make sure you discuss any questions you have with your health care provider. Document Released: 04/30/2008 Document Revised: 08/06/2016 Document Reviewed: 08/06/2016 Elsevier Interactive Patient Education  2017 Elsevier Inc.      Agustina Caroli, MD [facility]

## 2017-12-04 ENCOUNTER — Encounter: Payer: Self-pay | Admitting: Emergency Medicine

## 2017-12-04 LAB — CBC WITH DIFFERENTIAL/PLATELET
BASOS: 0 %
Basophils Absolute: 0 10*3/uL (ref 0.0–0.2)
EOS (ABSOLUTE): 0.2 10*3/uL (ref 0.0–0.4)
EOS: 3 %
HEMATOCRIT: 39.3 % (ref 34.0–46.6)
Hemoglobin: 12.5 g/dL (ref 11.1–15.9)
Immature Grans (Abs): 0 10*3/uL (ref 0.0–0.1)
Immature Granulocytes: 0 %
Lymphocytes Absolute: 2 10*3/uL (ref 0.7–3.1)
Lymphs: 33 %
MCH: 25.9 pg — ABNORMAL LOW (ref 26.6–33.0)
MCHC: 31.8 g/dL (ref 31.5–35.7)
MCV: 82 fL (ref 79–97)
MONOS ABS: 0.4 10*3/uL (ref 0.1–0.9)
Monocytes: 7 %
NEUTROS PCT: 57 %
Neutrophils Absolute: 3.5 10*3/uL (ref 1.4–7.0)
Platelets: 304 10*3/uL (ref 150–379)
RBC: 4.82 x10E6/uL (ref 3.77–5.28)
RDW: 14.4 % (ref 12.3–15.4)
WBC: 6.1 10*3/uL (ref 3.4–10.8)

## 2017-12-04 LAB — COMPREHENSIVE METABOLIC PANEL
A/G RATIO: 1.5 (ref 1.2–2.2)
ALBUMIN: 4.4 g/dL (ref 3.5–5.5)
ALT: 11 IU/L (ref 0–32)
AST: 15 IU/L (ref 0–40)
Alkaline Phosphatase: 87 IU/L (ref 39–117)
BUN / CREAT RATIO: 9 (ref 9–23)
BUN: 8 mg/dL (ref 6–20)
Bilirubin Total: 0.3 mg/dL (ref 0.0–1.2)
CO2: 22 mmol/L (ref 20–29)
Calcium: 9.4 mg/dL (ref 8.7–10.2)
Chloride: 104 mmol/L (ref 96–106)
Creatinine, Ser: 0.85 mg/dL (ref 0.57–1.00)
GFR, EST AFRICAN AMERICAN: 103 mL/min/{1.73_m2} (ref >59)
GFR, EST NON AFRICAN AMERICAN: 90 mL/min/{1.73_m2} (ref >59)
GLOBULIN, TOTAL: 3 g/dL (ref 1.5–4.5)
Glucose: 108 mg/dL — ABNORMAL HIGH (ref 65–99)
POTASSIUM: 4.1 mmol/L (ref 3.5–5.2)
SODIUM: 141 mmol/L (ref 134–144)
TOTAL PROTEIN: 7.4 g/dL (ref 6.0–8.5)

## 2018-04-23 ENCOUNTER — Ambulatory Visit: Payer: 59 | Admitting: Emergency Medicine

## 2018-04-25 ENCOUNTER — Telehealth: Payer: Self-pay | Admitting: Emergency Medicine

## 2018-04-25 ENCOUNTER — Encounter: Payer: Self-pay | Admitting: Emergency Medicine

## 2018-04-25 ENCOUNTER — Ambulatory Visit (INDEPENDENT_AMBULATORY_CARE_PROVIDER_SITE_OTHER): Payer: 59 | Admitting: Emergency Medicine

## 2018-04-25 ENCOUNTER — Other Ambulatory Visit: Payer: Self-pay

## 2018-04-25 VITALS — BP 108/64 | HR 91 | Temp 98.9°F | Resp 16 | Ht 66.25 in | Wt 200.6 lb

## 2018-04-25 DIAGNOSIS — Z124 Encounter for screening for malignant neoplasm of cervix: Secondary | ICD-10-CM | POA: Diagnosis not present

## 2018-04-25 DIAGNOSIS — Z01411 Encounter for gynecological examination (general) (routine) with abnormal findings: Secondary | ICD-10-CM

## 2018-04-25 DIAGNOSIS — N760 Acute vaginitis: Secondary | ICD-10-CM

## 2018-04-25 DIAGNOSIS — B9689 Other specified bacterial agents as the cause of diseases classified elsewhere: Secondary | ICD-10-CM

## 2018-04-25 LAB — POCT WET + KOH PREP
Trich by wet prep: ABSENT
YEAST BY KOH: ABSENT
Yeast by wet prep: ABSENT

## 2018-04-25 LAB — POCT URINE PREGNANCY: PREG TEST UR: NEGATIVE

## 2018-04-25 MED ORDER — METRONIDAZOLE 500 MG PO TABS: 500.0000 mg | ORAL_TABLET | Freq: Two times a day (BID) | ORAL | 0 refills | Status: AC

## 2018-04-25 NOTE — Progress Notes (Signed)
Diane Snyder 37 y.o.   Chief Complaint  Patient presents with  . Gynecologic Exam    PAP SMEAR ONLY - per patient overdue    HISTORY OF PRESENT ILLNESS: This is a 37 y.o. female here for GYN maintenance.  Pap smear is due.  Noticed a vaginal discharge 2 days ago.  Denies pelvic or vaginal plane.  Has an IUD.  No other significant symptoms.  HPI   Prior to Admission medications   Medication Sig Start Date End Date Taking? Authorizing Provider  Acetaminophen (TYLENOL PO) Take by mouth as needed.   Yes [provider]  ibuprofen (ADVIL,MOTRIN) 200 MG tablet Take 200 mg by mouth every 6 (six) hours as needed.   Yes [provider]  naproxen sodium (ANAPROX DS) 550 MG tablet Take 1 tablet (550 mg total) by mouth 2 (two) times daily with a meal. Patient not taking: Reported on 12/03/2017 05/08/16   Konrad Felix, PA    No Known Allergies  Patient Active Problem List   Diagnosis Date Noted  . Chest pain 12/03/2017    No past medical history on file.  Past Surgical History:  Procedure Laterality Date  . CHOLECYSTECTOMY    . cholestectomy       Social History   Socioeconomic History  . Marital status: Single    Spouse name: Not on file  . Number of children: Not on file  . Years of education: Not on file  . Highest education level: Not on file  Occupational History  . Not on file  Social Needs  . Financial resource strain: Not on file  . Food insecurity:    Worry: Not on file    Inability: Not on file  . Transportation needs:    Medical: Not on file    Non-medical: Not on file  Tobacco Use  . Smoking status: Former Research scientist (life sciences)  . Smokeless tobacco: Never Used  Substance and Sexual Activity  . Alcohol use: Yes  . Drug use: No  . Sexual activity: Yes    Birth control/protection: IUD  Lifestyle  . Physical activity:    Days per week: Not on file    Minutes per session: Not on file  . Stress: Not on file  Relationships  . Social  connections:    Talks on phone: Not on file    Gets together: Not on file    Attends religious service: Not on file    Active member of club or organization: Not on file    Attends meetings of clubs or organizations: Not on file    Relationship status: Not on file  . Intimate partner violence:    Fear of current or ex partner: Not on file    Emotionally abused: Not on file    Physically abused: Not on file    Forced sexual activity: Not on file  Other Topics Concern  . Not on file  Social History Narrative  . Not on file    Family History  Problem Relation Age of Onset  . Hypertension Mother   . Heart disease Maternal Grandmother   . Hypertension Maternal Grandmother   . Diabetes Maternal Grandmother   . Stroke Maternal Grandfather   . Heart disease Maternal Grandfather   . Heart attack Maternal Grandfather      Review of Systems  Constitutional: Negative.   HENT: Negative.   Eyes: Negative.   Respiratory: Negative.   Cardiovascular: Negative.   Gastrointestinal: Negative.   Genitourinary: Negative.  Vaginal discharge  Musculoskeletal: Negative.   Skin: Negative.   Neurological: Negative.   Endo/Heme/Allergies: Negative.   Psychiatric/Behavioral: Negative.   All other systems reviewed and are negative.   Vitals:   04/25/18 1526  BP: 108/64  Pulse: 91  Resp: 16  Temp: 98.9 F (37.2 C)  SpO2: 99%    Physical Exam  Constitutional: She is oriented to person, place, and time. She appears well-developed and well-nourished.  HENT:  Head: Normocephalic and atraumatic.  Eyes: Pupils are equal, round, and reactive to light. EOM are normal.  Neck: Normal range of motion. Neck supple.  Cardiovascular: Normal rate and regular rhythm.  Pulmonary/Chest: Effort normal.  Abdominal: Soft. She exhibits no distension. There is no tenderness. Hernia confirmed negative in the right inguinal area and confirmed negative in the left inguinal area.  Genitourinary: There  is no rash, tenderness or lesion on the right labia. There is no rash, tenderness or lesion on the left labia. Cervix exhibits no motion tenderness, no discharge and no friability. No erythema, tenderness or bleeding in the vagina. No foreign body in the vagina. No signs of injury around the vagina. Vaginal discharge (Thick mucousy) found.  Musculoskeletal: Normal range of motion.  Lymphadenopathy: No inguinal adenopathy noted on the right or left side.  Neurological: She is alert and oriented to person, place, and time.  Skin: Skin is warm and dry. Capillary refill takes less than 2 seconds.  Psychiatric: She has a normal mood and affect. Her behavior is normal.  Vitals reviewed.    ASSESSMENT & PLAN: Samie was seen today for gynecologic exam.  Diagnoses and all orders for this visit:  Screening for malignant neoplasm of cervix -     Pap IG, CT/NG NAA, and HPV (high risk) ([facility])  Encounter for gynecological examination with abnormal finding -     Wet prep, genital -     GC/Chlamydia Probe Amp (genital or urine) ([facility]) -     POCT Wet Prep [facility])    Patient Instructions       IF you received an x-ray today, you will receive an invoice from [facility]. Please contact [facility] at [phone] with questions or concerns regarding your invoice.   IF you received labwork today, you will receive an invoice from [facility]. Please contact [facility] at [phone] with questions or concerns regarding your invoice.   Our billing staff will not be able to assist you with questions regarding bills from these companies.  You will be contacted with the lab results as soon as they are available. The fastest way to get your results is to activate your My Chart account. Instructions are located on the last page of this paperwork. If you have not heard from [place] regarding the results in 2 weeks, please contact this office.      Preventive  Care 18-39 Years, Female Preventive care refers to lifestyle choices and visits with your health care provider that can promote health and wellness. What does preventive care include?  A yearly physical exam. This is also called an annual well check.  Dental exams once or twice a year.  Routine eye exams. Ask your health care provider how often you should have your eyes checked.  Personal lifestyle choices, including: ? Daily care of your teeth and gums. ? Regular physical activity. ? Eating a healthy diet. ? Avoiding tobacco and drug use. ? Limiting alcohol use. ? Practicing safe sex. ? Taking vitamin and mineral supplements  as recommended by your health care provider. What happens during an annual well check? The services and screenings done by your health care provider during your annual well check will depend on your age, overall health, lifestyle risk factors, and family history of disease. Counseling Your health care provider may ask you questions about your:  Alcohol use.  Tobacco use.  Drug use.  Emotional well-being.  Home and relationship well-being.  Sexual activity.  Eating habits.  Work and work Statistician.  Method of birth control.  Menstrual cycle.  Pregnancy history.  Screening You may have the following tests or measurements:  Height, weight, and BMI.  Diabetes screening. This is done by checking your blood sugar (glucose) after you have not eaten for a while (fasting).  Blood pressure.  Lipid and cholesterol levels. These may be checked every 5 years starting at age 17.  Skin check.  Hepatitis C blood test.  Hepatitis B blood test.  Sexually transmitted disease (STD) testing.  BRCA-related cancer screening. This may be done if you have a family history of breast, ovarian, tubal, or peritoneal cancers.  Pelvic exam and Pap test. This may be done every 3 years starting at age 73. Starting at age 84, this may be done every 5 years if  you have a Pap test in combination with an HPV test.  Discuss your test results, treatment options, and if necessary, the need for more tests with your health care provider. Vaccines Your health care provider may recommend certain vaccines, such as:  Influenza vaccine. This is recommended every year.  Tetanus, diphtheria, and acellular pertussis (Tdap, Td) vaccine. You may need a Td booster every 10 years.  Varicella vaccine. You may need this if you have not been vaccinated.  HPV vaccine. If you are 55 or younger, you may need three doses over 6 months.  Measles, mumps, and rubella (MMR) vaccine. You may need at least one dose of MMR. You may also need a second dose.  Pneumococcal 13-valent conjugate (PCV13) vaccine. You may need this if you have certain conditions and were not previously vaccinated.  Pneumococcal polysaccharide (PPSV23) vaccine. You may need one or two doses if you smoke cigarettes or if you have certain conditions.  Meningococcal vaccine. One dose is recommended if you are age 25-21 years and a first-year college student living in a residence hall, or if you have one of several medical conditions. You may also need additional booster doses.  Hepatitis A vaccine. You may need this if you have certain conditions or if you travel or work in places where you may be exposed to hepatitis A.  Hepatitis B vaccine. You may need this if you have certain conditions or if you travel or work in places where you may be exposed to hepatitis B.  Haemophilus influenzae type b (Hib) vaccine. You may need this if you have certain risk factors.  Talk to your health care provider about which screenings and vaccines you need and how often you need them. This information is not intended to replace advice given to you by your health care provider. Make sure you discuss any questions you have with your health care provider. Document Released: 01/08/2002 Document Revised: 08/01/2016 Document  Reviewed: 09/13/2015 Elsevier Interactive Patient Education  2018 Elsevier Inc.      Agustina Caroli, MD [facility]

## 2018-04-25 NOTE — Patient Instructions (Addendum)
   IF you received an x-ray today, you will receive an invoice from [facility]. Please contact [facility] at [phone] with questions or concerns regarding your invoice.   IF you received labwork today, you will receive an invoice from [facility]. Please contact [facility] at [phone] with questions or concerns regarding your invoice.   Our billing staff will not be able to assist you with questions regarding bills from these companies.  You will be contacted with the lab results as soon as they are available. The fastest way to get your results is to activate your My Chart account. Instructions are located on the last page of this paperwork. If you have not heard from us regarding the results in 2 weeks, please contact this office.      Preventive Care 18-39 Years, Female Preventive care refers to lifestyle choices and visits with your health care provider that can promote health and wellness. What does preventive care include?  A yearly physical exam. This is also called an annual well check.  Dental exams once or twice a year.  Routine eye exams. Ask your health care provider how often you should have your eyes checked.  Personal lifestyle choices, including: ? Daily care of your teeth and gums. ? Regular physical activity. ? Eating a healthy diet. ? Avoiding tobacco and drug use. ? Limiting alcohol use. ? Practicing safe sex. ? Taking vitamin and mineral supplements as recommended by your health care provider. What happens during an annual well check? The services and screenings done by your health care provider during your annual well check will depend on your age, overall health, lifestyle risk factors, and family history of disease. Counseling Your health care provider may ask you questions about your:  Alcohol use.  Tobacco use.  Drug use.  Emotional well-being.  Home and relationship well-being.  Sexual activity.  Eating  habits.  Work and work environment.  Method of birth control.  Menstrual cycle.  Pregnancy history.  Screening You may have the following tests or measurements:  Height, weight, and BMI.  Diabetes screening. This is done by checking your blood sugar (glucose) after you have not eaten for a while (fasting).  Blood pressure.  Lipid and cholesterol levels. These may be checked every 5 years starting at age 20.  Skin check.  Hepatitis C blood test.  Hepatitis B blood test.  Sexually transmitted disease (STD) testing.  BRCA-related cancer screening. This may be done if you have a family history of breast, ovarian, tubal, or peritoneal cancers.  Pelvic exam and Pap test. This may be done every 3 years starting at age 21. Starting at age 30, this may be done every 5 years if you have a Pap test in combination with an HPV test.  Discuss your test results, treatment options, and if necessary, the need for more tests with your health care provider. Vaccines Your health care provider may recommend certain vaccines, such as:  Influenza vaccine. This is recommended every year.  Tetanus, diphtheria, and acellular pertussis (Tdap, Td) vaccine. You may need a Td booster every 10 years.  Varicella vaccine. You may need this if you have not been vaccinated.  HPV vaccine. If you are 26 or younger, you may need three doses over 6 months.  Measles, mumps, and rubella (MMR) vaccine. You may need at least one dose of MMR. You may also need a second dose.  Pneumococcal 13-valent conjugate (PCV13) vaccine. You may need this if you have certain conditions   and were not previously vaccinated.  Pneumococcal polysaccharide (PPSV23) vaccine. You may need one or two doses if you smoke cigarettes or if you have certain conditions.  Meningococcal vaccine. One dose is recommended if you are age 90-21 years and a first-year college student living in a residence hall, or if you have one of several  medical conditions. You may also need additional booster doses.  Hepatitis A vaccine. You may need this if you have certain conditions or if you travel or work in places where you may be exposed to hepatitis A.  Hepatitis B vaccine. You may need this if you have certain conditions or if you travel or work in places where you may be exposed to hepatitis B.  Haemophilus influenzae type b (Hib) vaccine. You may need this if you have certain risk factors.  Talk to your health care provider about which screenings and vaccines you need and how often you need them. This information is not intended to replace advice given to you by your health care provider. Make sure you discuss any questions you have with your health care provider. Document Released: 01/08/2002 Document Revised: 08/01/2016 Document Reviewed: 09/13/2015 Elsevier Interactive Patient Education  2018 Elsevier Inc.  Bacterial Vaginosis Bacterial vaginosis is an infection of the vagina. It happens when too many germs (bacteria) grow in the vagina. This infection puts you at risk for infections from sex (STIs). Treating this infection can lower your risk for some STIs. You should also treat this if you are pregnant. It can cause your baby to be born early. Follow these instructions at home: Medicines  Take over-the-counter and prescription medicines only as told by your doctor.  Take or use your antibiotic medicine as told by your doctor. Do not stop taking or using it even if you start to feel better. General instructions  If you your sexual partner is a woman, tell her that you have this infection. She needs to get treatment if she has symptoms. If you have a female partner, he does not need to be treated.  During treatment: ? Avoid sex. ? Do not douche. ? Avoid alcohol as told. ? Avoid breastfeeding as told.  Drink enough fluid to keep your pee (urine) clear or pale yellow.  Keep your vagina and butt (rectum) clean. ? Wash the  area with warm water every day. ? Wipe from front to back after you use the toilet.  Keep all follow-up visits as told by your doctor. This is important. Preventing this condition  Do not douche.  Use only warm water to wash around your vagina.  Use protection when you have sex. This includes: ? Latex condoms. ? Dental dams.  Limit how many people you have sex with. It is best to only have sex with the same person (be monogamous).  Get tested for STIs. Have your partner get tested.  Wear underwear that is cotton or lined with cotton.  Avoid tight pants and pantyhose. This is most important in summer.  Do not use any products that have nicotine or tobacco in them. These include cigarettes and e-cigarettes. If you need help quitting, ask your doctor.  Do not use illegal drugs.  Limit how much alcohol you drink. Contact a doctor if:  Your symptoms do not get better, even after you are treated.  You have more discharge or pain when you pee (urinate).  You have a fever.  You have pain in your belly (abdomen).  You have pain with sex.  Your bleed from your vagina between periods. Summary  This infection happens when too many germs (bacteria) grow in the vagina.  Treating this condition can lower your risk for some infections from sex (STIs).  You should also treat this if you are pregnant. It can cause early (premature) birth.  Do not stop taking or using your antibiotic medicine even if you start to feel better. This information is not intended to replace advice given to you by your health care provider. Make sure you discuss any questions you have with your health care provider. Document Released: 08/21/2008 Document Revised: 07/28/2016 Document Reviewed: 07/28/2016 Elsevier Interactive Patient Education  2017 [employer].

## 2018-04-28 LAB — GC/CHLAMYDIA PROBE AMP
CHLAMYDIA, DNA PROBE: NEGATIVE
NEISSERIA GONORRHOEAE BY PCR: NEGATIVE

## 2018-04-28 LAB — TRICHOMONAS VAGINALIS, PROBE AMP: Trich vag by NAA: NEGATIVE

## 2018-04-29 LAB — PAP IG, CT-NG NAA, HPV HIGH-RISK
Chlamydia, Nuc. Acid Amp: NEGATIVE
Gonococcus by Nucleic Acid Amp: NEGATIVE
HPV, high-risk: NEGATIVE
PAP Smear Comment: 0

## 2018-04-30 ENCOUNTER — Encounter: Payer: Self-pay | Admitting: *Deleted

## 2018-09-05 NOTE — Congregational Nurse Program (Signed)
  Dept: [facility] Nurse Program Note  Date of Encounter: 09/05/2018  Past Medical History: No past medical history on file.  Encounter Details: CNP Questionnaire - 09/05/18 1020      Questionnaire   Patient Status  Not Applicable    Race  Black or African American    Location Patient Served At  [employer]    Uninsured  Not Applicable    Food  No food insecurities    Housing/Utilities  Yes, have permanent housing    Transportation  No transportation needs    Interpersonal Safety  Yes, feel physically and emotionally safe where you currently live    Medication  No medication insecurities    Medical Provider  Yes    Referrals  Not Applicable    ED Visit Averted  Not Applicable    Life-Saving Intervention Made  Not Applicable      Encounter Spiritual Care and Concern. Talked with her and gave some encouragement about her personal problem. Will continue to give support. [employer] Tressia Labrum RN BSN CN [facility] PhD. [phone]

## 2018-10-06 NOTE — Telephone Encounter (Signed)
No conversation

## 2018-12-01 LAB — HM COLONOSCOPY

## 2018-12-10 ENCOUNTER — Encounter: Payer: Self-pay | Admitting: *Deleted

## 2019-05-20 ENCOUNTER — Ambulatory Visit (INDEPENDENT_AMBULATORY_CARE_PROVIDER_SITE_OTHER): Payer: 59 | Admitting: Obstetrics & Gynecology

## 2019-05-20 ENCOUNTER — Encounter: Payer: Self-pay | Admitting: Obstetrics & Gynecology

## 2019-05-20 ENCOUNTER — Other Ambulatory Visit: Payer: Self-pay

## 2019-05-20 VITALS — BP 129/81 | HR 83 | Wt 199.0 lb

## 2019-05-20 DIAGNOSIS — Z30432 Encounter for removal of intrauterine contraceptive device: Secondary | ICD-10-CM | POA: Diagnosis not present

## 2019-05-20 NOTE — Progress Notes (Signed)
   Subjective:    Patient ID: Diane Snyder, female    DOB: 20-Jul-1981, 38 y.o.   MRN: [id number]  HPI 38 yo married P2 (75 and 106 yo kids) here for contraception discussion. She does not have periods. She wants it removed. Her husband plans to use condoms.    Review of Systems Pap normal 04/26/19.    Objective:   Physical Exam Breathing, conversing, and ambulating normally Well nourished, well hydrated Black female, no apparent distress Intact IUD easily removed She tolerated the procedure well.     Assessment & Plan:  Contraception- condoms prn Rec MVI daily

## 2019-11-27 NOTE — L&D Delivery Note (Addendum)
Patient: [name] MRN: [id number]  GBS status: Negative  Patient is a 39 y.o. now [id number] s/p NSVD at [redacted]w[redacted]d, who was admitted for IOL 2/2 A2GDM. AROM 2h 1m prior to delivery with clear fluid.   Delivery Note At 1:38 AM a viable female was delivered via Vaginal, Spontaneous (Presentation: Left Occiput Anterior).  APGAR: 8, 9; weight see delivery note.   Placenta status: Spontaneous, Intact.  Cord: 3 vessels with the following complications: None.  Cord pH: pending  Head delivered LOA. Nuchal cord present x2 not reducible. Shoulder and body delivered in usual fashion. Infant with spontaneous cry, placed on mother's abdomen, dried and bulb suctioned. Cord clamped x 2 after 1-minute delay, and cut by family member. Cord blood drawn. Placenta delivered spontaneously with gentle cord traction. Fundus firm with massage and Pitocin. Perineum inspected and found to have no lacerations.  Anesthesia: Epidural Episiotomy: None Lacerations: None Suture Repair: N/A Est. Blood Loss (mL): 100  Mom to postpartum.  Baby to Couplet care / Skin to Skin.  Simone Autry-Lott 10/02/2020, 2:13 AM  I was gloved and present for delivery of infant and placenta. I agree with resident's documentation as noted above.  Randa Ngo, MD OB Fellow, [facility] 10/02/2020 3:04 AM

## 2020-02-03 ENCOUNTER — Encounter: Payer: Self-pay | Admitting: Obstetrics

## 2020-02-03 ENCOUNTER — Other Ambulatory Visit: Payer: Self-pay

## 2020-02-03 ENCOUNTER — Ambulatory Visit (INDEPENDENT_AMBULATORY_CARE_PROVIDER_SITE_OTHER): Payer: 59 | Admitting: Obstetrics

## 2020-02-03 VITALS — BP 132/88 | HR 88 | Wt 209.0 lb

## 2020-02-03 DIAGNOSIS — Z3201 Encounter for pregnancy test, result positive: Secondary | ICD-10-CM | POA: Diagnosis not present

## 2020-02-03 DIAGNOSIS — Z124 Encounter for screening for malignant neoplasm of cervix: Secondary | ICD-10-CM

## 2020-02-03 DIAGNOSIS — Z1151 Encounter for screening for human papillomavirus (HPV): Secondary | ICD-10-CM

## 2020-02-03 DIAGNOSIS — Z01419 Encounter for gynecological examination (general) (routine) without abnormal findings: Secondary | ICD-10-CM | POA: Diagnosis not present

## 2020-02-03 DIAGNOSIS — N898 Other specified noninflammatory disorders of vagina: Secondary | ICD-10-CM | POA: Diagnosis not present

## 2020-02-03 DIAGNOSIS — Z113 Encounter for screening for infections with a predominantly sexual mode of transmission: Secondary | ICD-10-CM | POA: Diagnosis not present

## 2020-02-03 DIAGNOSIS — Z3169 Encounter for other general counseling and advice on procreation: Secondary | ICD-10-CM

## 2020-02-03 DIAGNOSIS — E669 Obesity, unspecified: Secondary | ICD-10-CM | POA: Diagnosis not present

## 2020-02-03 DIAGNOSIS — Z3687 Encounter for antenatal screening for uncertain dates: Secondary | ICD-10-CM

## 2020-02-03 LAB — POCT URINE PREGNANCY: Preg Test, Ur: POSITIVE — AB

## 2020-02-03 MED ORDER — PRENATE MINI 29-0.6-0.4-350 MG PO CAPS: 1.0000 | ORAL_CAPSULE | Freq: Every day | ORAL | 4 refills | Status: DC

## 2020-02-03 NOTE — Progress Notes (Signed)
Subjective:        Diane Snyder is a 39 y.o. female here for a routine exam.  Current complaints: Trying to conceive since last September without success after discontinuing the IUD in June 2020.  Personal health questionnaire:  Is patient Ashkenazi Jewish, have a family history of breast and/or ovarian cancer: no Is there a family history of uterine cancer diagnosed at age < 54, gastrointestinal cancer, urinary tract cancer, family member who is a Field seismologist syndrome-associated carrier: no Is the patient overweight and hypertensive, family history of diabetes, personal history of gestational diabetes, preeclampsia or PCOS: no Is patient over 12, have PCOS,  family history of premature CHD under age 28, diabetes, smoke, have hypertension or peripheral artery disease:  no At any time, has a partner hit, kicked or otherwise hurt or frightened you?: no Over the past 2 weeks, have you felt down, depressed or hopeless?: no Over the past 2 weeks, have you felt little interest or pleasure in doing things?:no   Gynecologic History Patient's last menstrual period was 02/01/2020. Contraception: none Last Pap: 04-25-2018. Results were: normal Last mammogram: n/a. Results were: n/a  Obstetric History OB History  Gravida Para Term Preterm AB Living  2 2 2     2   SAB TAB Ectopic Multiple Live Births          2    # Outcome Date GA Lbr Len/2nd Weight Sex Delivery Anes PTL Lv  2 Term 02/16/08 [redacted]w[redacted]d  7 lb 12 oz (3.515 kg) F Vag-Spont None  LIV  1 Term 10/11/06 [redacted]w[redacted]d  7 lb 11 oz (3.487 kg) M Vag-Spont EPI  LIV    Past Medical History:  Diagnosis Date  . Diverticulosis     Past Surgical History:  Procedure Laterality Date  . CHOLECYSTECTOMY    . cholestectomy        Current Outpatient Medications:  .  Chaste Tree (VITEX EXTRACT PO), Take by mouth., Disp: , Rfl:  .  Acetaminophen (TYLENOL PO), Take by mouth as needed., Disp: , Rfl:  .  ibuprofen (ADVIL,MOTRIN) 200 MG tablet, Take 200  mg by mouth every 6 (six) hours as needed., Disp: , Rfl:  .  naproxen sodium (ANAPROX DS) 550 MG tablet, Take 1 tablet (550 mg total) by mouth 2 (two) times daily with a meal. (Patient not taking: Reported on 12/03/2017), Disp: 30 tablet, Rfl: 0 .  Prenat w/o A-FeCbn-Meth-FA-DHA (PRENATE MINI) 29-0.6-0.4-350 MG CAPS, Take 1 capsule by mouth daily before breakfast., Disp: 90 capsule, Rfl: 4 Allergies  Allergen Reactions  . Contrast Media [Iodinated Diagnostic Agents]     Social History   Tobacco Use  . Smoking status: Former Research scientist (life sciences)  . Smokeless tobacco: Never Used  Substance Use Topics  . Alcohol use: Yes    Family History  Problem Relation Age of Onset  . Hypertension Mother   . Heart disease Maternal Grandmother   . Hypertension Maternal Grandmother   . Diabetes Maternal Grandmother   . Stroke Maternal Grandfather   . Heart disease Maternal Grandfather   . Heart attack Maternal Grandfather       Review of Systems  Constitutional: negative for fatigue and weight loss Respiratory: negative for cough and wheezing Cardiovascular: negative for chest pain, fatigue and palpitations Gastrointestinal: negative for abdominal pain and change in bowel habits Musculoskeletal:negative for myalgias Neurological: negative for gait problems and tremors Behavioral/Psych: negative for abusive relationship, depression Endocrine: negative for temperature intolerance    Genitourinary:positve for abnormal menstrual  periods, scant and irregular.  Negative for genital lesions, hot flashes, sexual problems and vaginal discharge Integument/breast: negative for breast lump, breast tenderness, nipple discharge and skin lesion(s)    Objective:       BP 132/88   Pulse 88   Wt 209 lb (94.8 kg)   LMP 02/01/2020 Comment: spotting only  BMI 33.48 kg/m  General:   alert  Skin:   no rash or abnormalities  Lungs:   clear to auscultation bilaterally  Heart:   regular rate and rhythm, S1, S2 normal, no  murmur, click, rub or gallop  Breasts:   normal without suspicious masses, skin or nipple changes or axillary nodes  Abdomen:  normal findings: no organomegaly, soft, non-tender and no hernia  Pelvis:  External genitalia: normal general appearance Urinary system: urethral meatus normal and bladder without fullness, nontender Vaginal: normal without tenderness, induration or masses Cervix: normal appearance Adnexa: normal bimanual exam Uterus: anteverted and non-tender, normal size   Lab Review Urine pregnancy test: Positive Labs reviewed yes Radiologic studies reviewed no  50% of 25 min visit spent on counseling and coordination of care.   Assessment:     1. Encounter for routine gynecological examination with Papanicolaou smear of cervix Rx: - Cytology - PAP( [place]) - POCT urine pregnancy  2. Vaginal discharge Rx: - Cervicovaginal ancillary only( [place])  3. Positive pregnancy test Rx: - Prenat w/o A-FeCbn-Meth-FA-DHA (PRENATE MINI) 29-0.6-0.4-350 MG CAPS; Take 1 capsule by mouth daily before breakfast.  Dispense: 90 capsule; Refill: 4 - US OB Transvaginal; Future  4. Encounter for preconception consultation - taking PNV's - encouraged to eat a good well balanced diet, drink at least 64 ounces of liquids a day, and get plenty of rest.  5. Obesity (BMI 30.0-34.9)  6. Unsure of LMP (last menstrual period) as reason for ultrasound scan Rx: - US OB Comp Less 14 Wks; Future - US OB Transvaginal; Future    Plan:    Follow up in 8 weeks  Meds ordered this encounter  Medications  . Prenat w/o A-FeCbn-Meth-FA-DHA (PRENATE MINI) 29-0.6-0.4-350 MG CAPS    Sig: Take 1 capsule by mouth daily before breakfast.    Dispense:  90 capsule    Refill:  4   Orders Placed This Encounter  Procedures  . US OB Comp Less 14 Wks    Standing Status:   Future    Standing Expiration Date:   04/04/2021    Order Specific Question:   Reason for Exam (SYMPTOM  OR DIAGNOSIS  REQUIRED)    Answer:   Unsure LMP.  Positive UPT.    Order Specific Question:   Preferred Imaging Location?    Answer:   Internal  . US OB Transvaginal    Standing Status:   Future    Standing Expiration Date:   04/04/2021    Order Specific Question:   Reason for Exam (SYMPTOM  OR DIAGNOSIS REQUIRED)    Answer:   Unsure LMP.  Positive UPT.    Order Specific Question:   Preferred Imaging Location?    Answer:   Internal  . POCT urine pregnancy     Shelly Bombard, MD 02/03/2020 3:53 PM

## 2020-02-05 LAB — CERVICOVAGINAL ANCILLARY ONLY
Bacterial Vaginitis (gardnerella): NEGATIVE
Candida Glabrata: NEGATIVE
Candida Vaginitis: NEGATIVE
Chlamydia: NEGATIVE
Comment: NEGATIVE
Comment: NEGATIVE
Comment: NEGATIVE
Comment: NEGATIVE
Comment: NEGATIVE
Comment: NORMAL
Neisseria Gonorrhea: NEGATIVE
Trichomonas: NEGATIVE

## 2020-02-05 LAB — CYTOLOGY - PAP
Comment: NEGATIVE
Diagnosis: NEGATIVE
High risk HPV: NEGATIVE

## 2020-03-01 ENCOUNTER — Telehealth: Payer: Self-pay | Admitting: *Deleted

## 2020-03-01 NOTE — Telephone Encounter (Signed)
Pt called to office stating that she is hungry very often, every 30-45 min. Pt states that she is not able to consume full meals.  States that if she doesn't eat often she may feel sick. Pt advised she may feel this way as her body is working to keep and maintain pregnancy.  Pt advised that she may want to try to snack throughout the day and not eat large meals. Advised to be aware of diet if she feels she is eating a lot more than normal.

## 2020-03-16 ENCOUNTER — Encounter: Payer: Self-pay | Admitting: Obstetrics

## 2020-03-16 ENCOUNTER — Ambulatory Visit (HOSPITAL_COMMUNITY)
Admission: RE | Admit: 2020-03-16 | Discharge: 2020-03-16 | Disposition: A | Discharge status: Home / Self Care | Payer: 59 | Source: Ambulatory Visit | Attending: Obstetrics | Admitting: Obstetrics

## 2020-03-16 ENCOUNTER — Other Ambulatory Visit: Payer: Self-pay | Admitting: Obstetrics

## 2020-03-16 ENCOUNTER — Telehealth (INDEPENDENT_AMBULATORY_CARE_PROVIDER_SITE_OTHER): Payer: 59 | Admitting: Obstetrics

## 2020-03-16 ENCOUNTER — Other Ambulatory Visit: Payer: Self-pay

## 2020-03-16 DIAGNOSIS — O3680X0 Pregnancy with inconclusive fetal viability, not applicable or unspecified: Secondary | ICD-10-CM

## 2020-03-16 DIAGNOSIS — Z3A00 Weeks of gestation of pregnancy not specified: Secondary | ICD-10-CM

## 2020-03-16 DIAGNOSIS — Z3687 Encounter for antenatal screening for uncertain dates: Secondary | ICD-10-CM

## 2020-03-16 MED ORDER — PRENATAL PLUS 27-1 MG PO TABS: 1.0000 | ORAL_TABLET | Freq: Every day | ORAL | 4 refills | Status: DC

## 2020-03-16 NOTE — Progress Notes (Addendum)
OBSTETRICS PRENATAL VIRTUAL VISIT ENCOUNTER NOTE  Provider location: [facility] [place] [facility] [place]   I connected with Fabio Pierce on 03/16/20 at  2:15 PM EDT by MyChart Video Encounter at home and verified that I am speaking with the correct person using two identifiers.   I discussed the limitations, risks, security and privacy concerns of performing an evaluation and management service virtually and the availability of in person appointments. I also discussed with the patient that there may be a patient responsible charge related to this service. The patient expressed understanding and agreed to proceed. Subjective:  Diane Snyder is a 39 y.o. G2P2002 at Unknown being seen today for ongoing prenatal care.  She is currently monitored for the following issues for this low-risk pregnancy and has Chest pain on their problem list.  Patient reports no complaints.   .  .   . Denies any leaking of fluid.   The following portions of the patient's history were reviewed and updated as appropriate: allergies, current medications, past family history, past medical history, past social history, past surgical history and problem list.   Objective:  There were no vitals filed for this visit.  Fetal Status:           General:  Alert, oriented and cooperative. Patient is in no acute distress.  Respiratory: Normal respiratory effort, no problems with respiration noted  Mental Status: Normal mood and affect. Normal behavior. Normal judgment and thought content.  Rest of physical exam deferred due to type of encounter  Imaging: US OB Comp Less 14 Wks  Result Date: 03/16/2020 CLINICAL DATA:  39 year old pregnant female with uncertain LMP. No acute symptoms reported. EXAM: OBSTETRIC <14 WK ULTRASOUND TECHNIQUE: Transabdominal ultrasound was performed for evaluation of the gestation as well as the maternal uterus and adnexal regions. COMPARISON:  No prior scans from this gestation.  FINDINGS: Intrauterine gestational sac: Single Yolk sac:  Not Visualized. Fetus: Visualized. Fetal Cardiac Activity: Visualized. Fetal Heart Rate: 171 bpm CRL:   37.2 mm   10 w 4 d                  [place] EDC: 10/08/2020 Subchorionic hemorrhage:  None visualized. Maternal uterus/adnexae: Right fundal 1.9 x 1.9 x 2.7 cm subserosal fibroid. Right ovary measures 2.8 x 2.2 x 1.9 cm. Left ovary measures 4.0 x 2.9 x 2.7 cm and contains a corpus luteum. No abnormal ovarian or adnexal masses. No abnormal free fluid. IMPRESSION: Single living intrauterine gestation at 10 weeks 4 days by crown-rump length, with no acute first-trimester gestational abnormality. Electronically Signed   By: Ilona Sorrel M.D.   On: 03/16/2020 09:10    Assessment and Plan:  Pregnancy: [id number] at Unknown 1. Encounter to determine fetal viability of pregnancy, single or unspecified fetus Rx: - prenatal vitamin w/FE, FA (PRENATAL 1 + 1) 27-1 MG TABS tablet; Take 1 tablet by mouth daily before breakfast.  Dispense: 90 tablet; Refill: 4   Preterm labor symptoms and general obstetric precautions including but not limited to vaginal bleeding, contractions, leaking of fluid and fetal movement were reviewed in detail with the patient. I discussed the assessment and treatment plan with the patient. The patient was provided an opportunity to ask questions and all were answered. The patient agreed with the plan and demonstrated an understanding of the instructions. The patient was advised to call back or seek an in-person office evaluation/go to [facility] at [facility] for any urgent or concerning  symptoms. Please refer to After Visit Summary for other counseling recommendations.   I provided 15 minutes of face-to-face time during this encounter.  Return in about 2 weeks (around 03/30/2020) for NOB visit.  Future Appointments  Date Time Provider [place]  03/16/2020  2:15 PM Shelly Bombard, MD [place] None  03/30/2020  1:00  PM Constant, Vickii Chafe, MD [place] None    Baltazar Najjar, [place] [facility] [place] [facility] 03/16/2020

## 2020-03-16 NOTE — Progress Notes (Signed)
S/w pt for virtual visit to discuss U/S results. ?

## 2020-03-16 NOTE — Addendum Note (Signed)
Addended by: Baltazar Najjar A on: 03/16/2020 01:58 PM   Modules accepted: Orders

## 2020-03-26 DIAGNOSIS — O24419 Gestational diabetes mellitus in pregnancy, unspecified control: Secondary | ICD-10-CM

## 2020-03-26 HISTORY — DX: Gestational diabetes mellitus in pregnancy, unspecified control: O24.419

## 2020-03-30 ENCOUNTER — Other Ambulatory Visit: Payer: Self-pay

## 2020-03-30 ENCOUNTER — Ambulatory Visit (INDEPENDENT_AMBULATORY_CARE_PROVIDER_SITE_OTHER): Payer: 59 | Admitting: Obstetrics and Gynecology

## 2020-03-30 ENCOUNTER — Encounter: Payer: Self-pay | Admitting: Obstetrics and Gynecology

## 2020-03-30 DIAGNOSIS — Z8759 Personal history of other complications of pregnancy, childbirth and the puerperium: Secondary | ICD-10-CM

## 2020-03-30 DIAGNOSIS — Z3A12 12 weeks gestation of pregnancy: Secondary | ICD-10-CM

## 2020-03-30 DIAGNOSIS — Z3480 Encounter for supervision of other normal pregnancy, unspecified trimester: Secondary | ICD-10-CM

## 2020-03-30 DIAGNOSIS — Z3481 Encounter for supervision of other normal pregnancy, first trimester: Secondary | ICD-10-CM

## 2020-03-30 DIAGNOSIS — O0990 Supervision of high risk pregnancy, unspecified, unspecified trimester: Secondary | ICD-10-CM | POA: Insufficient documentation

## 2020-03-30 DIAGNOSIS — O99210 Obesity complicating pregnancy, unspecified trimester: Secondary | ICD-10-CM

## 2020-03-30 DIAGNOSIS — O09522 Supervision of elderly multigravida, second trimester: Secondary | ICD-10-CM

## 2020-03-30 DIAGNOSIS — O09529 Supervision of elderly multigravida, unspecified trimester: Secondary | ICD-10-CM | POA: Insufficient documentation

## 2020-03-30 MED ORDER — DOXYLAMINE-PYRIDOXINE 10-10 MG PO TBEC: 2.0000 | DELAYED_RELEASE_TABLET | Freq: Every day | ORAL | 5 refills | Status: DC

## 2020-03-30 MED ORDER — PROMETHAZINE HCL 25 MG PO TABS
25.0000 mg | ORAL_TABLET | Freq: Four times a day (QID) | ORAL | 2 refills | Status: DC | PRN
Start: 2020-03-30 — End: 2020-07-06

## 2020-03-30 MED ORDER — ASPIRIN EC 81 MG PO TBEC
81.0000 mg | DELAYED_RELEASE_TABLET | Freq: Every day | ORAL | 2 refills | Status: DC
Start: 2020-03-30 — End: 2020-10-04

## 2020-03-30 MED ORDER — BLOOD PRESSURE KIT: 1.0000 | PACK | Freq: Once | 0 refills | Status: AC

## 2020-03-30 NOTE — Patient Instructions (Signed)
 Second Trimester of Pregnancy The second trimester is from week 14 through week 27 (months 4 through 6). The second trimester is often a time when you feel your best. Your body has adjusted to being pregnant, and you begin to feel better physically. Usually, morning sickness has lessened or quit completely, you may have more energy, and you may have an increase in appetite. The second trimester is also a time when the fetus is growing rapidly. At the end of the sixth month, the fetus is about 9 inches long and weighs about 1 pounds. You will likely begin to feel the baby move (quickening) between 16 and 20 weeks of pregnancy. Body changes during your second trimester Your body continues to go through many changes during your second trimester. The changes vary from woman to woman.  Your weight will continue to increase. You will notice your lower abdomen bulging out.  You may begin to get stretch marks on your hips, abdomen, and breasts.  You may develop headaches that can be relieved by medicines. The medicines should be approved by your health care provider.  You may urinate more often because the fetus is pressing on your bladder.  You may develop or continue to have heartburn as a result of your pregnancy.  You may develop constipation because certain hormones are causing the muscles that push waste through your intestines to slow down.  You may develop hemorrhoids or swollen, bulging veins (varicose veins).  You may have back pain. This is caused by: ? Weight gain. ? Pregnancy hormones that are relaxing the joints in your pelvis. ? A shift in weight and the muscles that support your balance.  Your breasts will continue to grow and they will continue to become tender.  Your gums may bleed and may be sensitive to brushing and flossing.  Dark spots or blotches (chloasma, mask of pregnancy) may develop on your face. This will likely fade after the baby is born.  A dark line from  your belly button to the pubic area (linea nigra) may appear. This will likely fade after the baby is born.  You may have changes in your hair. These can include thickening of your hair, rapid growth, and changes in texture. Some women also have hair loss during or after pregnancy, or hair that feels dry or thin. Your hair will most likely return to normal after your baby is born. What to expect at prenatal visits During a routine prenatal visit:  You will be weighed to make sure you and the fetus are growing normally.  Your blood pressure will be taken.  Your abdomen will be measured to track your baby's growth.  The fetal heartbeat will be listened to.  Any test results from the previous visit will be discussed. Your health care provider may ask you:  How you are feeling.  If you are feeling the baby move.  If you have had any abnormal symptoms, such as leaking fluid, bleeding, severe headaches, or abdominal cramping.  If you are using any tobacco products, including cigarettes, chewing tobacco, and electronic cigarettes.  If you have any questions. Other tests that may be performed during your second trimester include:  Blood tests that check for: ? Low iron levels (anemia). ? High blood sugar that affects pregnant women (gestational diabetes) between 24 and 28 weeks. ? Rh antibodies. This is to check for a protein on red blood cells (Rh factor).  Urine tests to check for infections, diabetes, or protein in   the urine.  An ultrasound to confirm the proper growth and development of the baby.  An amniocentesis to check for possible genetic problems.  Fetal screens for spina bifida and Down syndrome.  HIV (human immunodeficiency virus) testing. Routine prenatal testing includes screening for HIV, unless you choose not to have this test. Follow these instructions at home: Medicines  Follow your health care provider's instructions regarding medicine use. Specific medicines  may be either safe or unsafe to take during pregnancy.  Take a prenatal vitamin that contains at least 600 micrograms (mcg) of folic acid.  If you develop constipation, try taking a stool softener if your health care provider approves. Eating and drinking   Eat a balanced diet that includes fresh fruits and vegetables, whole grains, good sources of protein such as meat, eggs, or tofu, and low-fat dairy. Your health care provider will help you determine the amount of weight gain that is right for you.  Avoid raw meat and uncooked cheese. These carry germs that can cause birth defects in the baby.  If you have low calcium intake from food, talk to your health care provider about whether you should take a daily calcium supplement.  Limit foods that are high in fat and processed sugars, such as fried and sweet foods.  To prevent constipation: ? Drink enough fluid to keep your urine clear or pale yellow. ? Eat foods that are high in fiber, such as fresh fruits and vegetables, whole grains, and beans. Activity  Exercise only as directed by your health care provider. Most women can continue their usual exercise routine during pregnancy. Try to exercise for 30 minutes at least 5 days a week. Stop exercising if you experience uterine contractions.  Avoid heavy lifting, wear low heel shoes, and practice good posture.  A sexual relationship may be continued unless your health care provider directs you otherwise. Relieving pain and discomfort  Wear a good support bra to prevent discomfort from breast tenderness.  Take warm sitz baths to soothe any pain or discomfort caused by hemorrhoids. Use hemorrhoid cream if your health care provider approves.  Rest with your legs elevated if you have leg cramps or low back pain.  If you develop varicose veins, wear support hose. Elevate your feet for 15 minutes, 3-4 times a day. Limit salt in your diet. Prenatal Care  Write down your questions. Take  them to your prenatal visits.  Keep all your prenatal visits as told by your health care provider. This is important. Safety  Wear your seat belt at all times when driving.  Make a list of emergency phone numbers, including numbers for family, friends, the hospital, and police and fire departments. General instructions  Ask your health care provider for a referral to a local prenatal education class. Begin classes no later than the beginning of month 6 of your pregnancy.  Ask for help if you have counseling or nutritional needs during pregnancy. Your health care provider can offer advice or refer you to specialists for help with various needs.  Do not use hot tubs, steam rooms, or saunas.  Do not douche or use tampons or scented sanitary pads.  Do not cross your legs for long periods of time.  Avoid cat litter boxes and soil used by cats. These carry germs that can cause birth defects in the baby and possibly loss of the fetus by miscarriage or stillbirth.  Avoid all smoking, herbs, alcohol, and unprescribed drugs. Chemicals in these products can affect the   formation and growth of the baby.  Do not use any products that contain nicotine or tobacco, such as cigarettes and e-cigarettes. If you need help quitting, ask your health care provider.  Visit your dentist if you have not gone yet during your pregnancy. Use a soft toothbrush to brush your teeth and be gentle when you floss. Contact a health care provider if:  You have dizziness.  You have mild pelvic cramps, pelvic pressure, or nagging pain in the abdominal area.  You have persistent nausea, vomiting, or diarrhea.  You have a bad smelling vaginal discharge.  You have pain when you urinate. Get help right away if:  You have a fever.  You are leaking fluid from your vagina.  You have spotting or bleeding from your vagina.  You have severe abdominal cramping or pain.  You have rapid weight gain or weight loss.  You  have shortness of breath with chest pain.  You notice sudden or extreme swelling of your face, hands, ankles, feet, or legs.  You have not felt your baby move in over an hour.  You have severe headaches that do not go away when you take medicine.  You have vision changes. Summary  The second trimester is from week 14 through week 27 (months 4 through 6). It is also a time when the fetus is growing rapidly.  Your body goes through many changes during pregnancy. The changes vary from woman to woman.  Avoid all smoking, herbs, alcohol, and unprescribed drugs. These chemicals affect the formation and growth your baby.  Do not use any tobacco products, such as cigarettes, chewing tobacco, and e-cigarettes. If you need help quitting, ask your health care provider.  Contact your health care provider if you have any questions. Keep all prenatal visits as told by your health care provider. This is important. This information is not intended to replace advice given to you by your health care provider. Make sure you discuss any questions you have with your health care provider. Document Revised: 03/06/2019 Document Reviewed: 12/18/2016 [employer] Patient Education  2020 [employer]   Contraception Choices Contraception, also called birth control, refers to methods or devices that prevent pregnancy. Hormonal methods Contraceptive implant  A contraceptive implant is a thin, plastic tube that contains a hormone. It is inserted into the upper part of the arm. It can remain in place for up to 3 years. Progestin-only injections Progestin-only injections are injections of progestin, a synthetic form of the hormone progesterone. They are given every 3 months by a health care provider. Birth control pills  Birth control pills are pills that contain hormones that prevent pregnancy. They must be taken once a day, preferably at the same time each day. Birth control patch  The birth control patch  contains hormones that prevent pregnancy. It is placed on the skin and must be changed once a week for three weeks and removed on the fourth week. A prescription is needed to use this method of contraception. Vaginal ring  A vaginal ring contains hormones that prevent pregnancy. It is placed in the vagina for three weeks and removed on the fourth week. After that, the process is repeated with a new ring. A prescription is needed to use this method of contraception. Emergency contraceptive Emergency contraceptives prevent pregnancy after unprotected sex. They come in pill form and can be taken up to 5 days after sex. They work best the sooner they are taken after having sex. Most emergency contraceptives are available   without a prescription. This method should not be used as your only form of birth control. Barrier methods Female condom  A female condom is a thin sheath that is worn over the penis during sex. Condoms keep sperm from going inside a woman's body. They can be used with a spermicide to increase their effectiveness. They should be disposed after a single use. Female condom  A female condom is a soft, loose-fitting sheath that is put into the vagina before sex. The condom keeps sperm from going inside a woman's body. They should be disposed after a single use. Diaphragm  A diaphragm is a soft, dome-shaped barrier. It is inserted into the vagina before sex, along with a spermicide. The diaphragm blocks sperm from entering the uterus, and the spermicide kills sperm. A diaphragm should be left in the vagina for 6-8 hours after sex and removed within 24 hours. A diaphragm is prescribed and fitted by a health care provider. A diaphragm should be replaced every 1-2 years, after giving birth, after gaining more than 15 lb (6.8 kg), and after pelvic surgery. Cervical cap  A cervical cap is a round, soft latex or plastic cup that fits over the cervix. It is inserted into the vagina before sex, along  with spermicide. It blocks sperm from entering the uterus. The cap should be left in place for 6-8 hours after sex and removed within 48 hours. A cervical cap must be prescribed and fitted by a health care provider. It should be replaced every 2 years. Sponge  A sponge is a soft, circular piece of polyurethane foam with spermicide on it. The sponge helps block sperm from entering the uterus, and the spermicide kills sperm. To use it, you make it wet and then insert it into the vagina. It should be inserted before sex, left in for at least 6 hours after sex, and removed and thrown away within 30 hours. Spermicides Spermicides are chemicals that kill or block sperm from entering the cervix and uterus. They can come as a cream, jelly, suppository, foam, or tablet. A spermicide should be inserted into the vagina with an applicator at least 10-15 minutes before sex to allow time for it to work. The process must be repeated every time you have sex. Spermicides do not require a prescription. Intrauterine contraception Intrauterine device (IUD) An IUD is a T-shaped device that is put in a woman's uterus. There are two types:  Hormone IUD.This type contains progestin, a synthetic form of the hormone progesterone. This type can stay in place for 3-5 years.  Copper IUD.This type is wrapped in copper wire. It can stay in place for 10 years.  Permanent methods of contraception Female tubal ligation In this method, a woman's fallopian tubes are sealed, tied, or blocked during surgery to prevent eggs from traveling to the uterus. Hysteroscopic sterilization In this method, a small, flexible insert is placed into each fallopian tube. The inserts cause scar tissue to form in the fallopian tubes and block them, so sperm cannot reach an egg. The procedure takes about 3 months to be effective. Another form of birth control must be used during those 3 months. Female sterilization This is a procedure to tie off the  tubes that carry sperm (vasectomy). After the procedure, the man can still ejaculate fluid (semen). Natural planning methods Natural family planning In this method, a couple does not have sex on days when the woman could become pregnant. Calendar method This means keeping track of the length   of each menstrual cycle, identifying the days when pregnancy can happen, and not having sex on those days. Ovulation method In this method, a couple avoids sex during ovulation. Symptothermal method This method involves not having sex during ovulation. The woman typically checks for ovulation by watching changes in her temperature and in the consistency of cervical mucus. Post-ovulation method In this method, a couple waits to have sex until after ovulation. Summary  Contraception, also called birth control, means methods or devices that prevent pregnancy.  Hormonal methods of contraception include implants, injections, pills, patches, vaginal rings, and emergency contraceptives.  Barrier methods of contraception can include female condoms, female condoms, diaphragms, cervical caps, sponges, and spermicides.  There are two types of IUDs (intrauterine devices). An IUD can be put in a woman's uterus to prevent pregnancy for 3-5 years.  Permanent sterilization can be done through a procedure for males, females, or both.  Natural family planning methods involve not having sex on days when the woman could become pregnant. This information is not intended to replace advice given to you by your health care provider. Make sure you discuss any questions you have with your health care provider. Document Revised: 11/14/2017 Document Reviewed: 12/15/2016 Else[employer] Patient Education  2020 [employer]   Breastfeeding  Choosing to breastfeed is one of the best decisions you can make for yourself and your baby. A change in hormones during pregnancy causes your breasts to make breast milk in your milk-producing  glands. Hormones prevent breast milk from being released before your baby is born. They also prompt milk flow after birth. Once breastfeeding has begun, thoughts of your baby, as well as his or her sucking or crying, can stimulate the release of milk from your milk-producing glands. Benefits of breastfeeding Research shows that breastfeeding offers many health benefits for infants and mothers. It also offers a cost-free and convenient way to feed your baby. For your baby  Your first milk (colostrum) helps your baby's digestive system to function better.  Special cells in your milk (antibodies) help your baby to fight off infections.  Breastfed babies are less likely to develop asthma, allergies, obesity, or type 2 diabetes. They are also at lower risk for sudden infant death syndrome (SIDS).  Nutrients in breast milk are better able to meet your baby's needs compared to infant formula.  Breast milk improves your baby's brain development. For you  Breastfeeding helps to create a very special bond between you and your baby.  Breastfeeding is convenient. Breast milk costs nothing and is always available at the correct temperature.  Breastfeeding helps to burn calories. It helps you to lose the weight that you gained during pregnancy.  Breastfeeding makes your uterus return faster to its size before pregnancy. It also slows bleeding (lochia) after you give birth.  Breastfeeding helps to lower your risk of developing type 2 diabetes, osteoporosis, rheumatoid arthritis, cardiovascular disease, and breast, ovarian, uterine, and endometrial cancer later in life. Breastfeeding basics Starting breastfeeding  Find a comfortable place to sit or lie down, with your neck and back well-supported.  Place a pillow or a rolled-up blanket under your baby to bring him or her to the level of your breast (if you are seated). Nursing pillows are specially designed to help support your arms and your baby while  you breastfeed.  Make sure that your baby's tummy (abdomen) is facing your abdomen.  Gently massage your breast. With your fingertips, massage from the outer edges of your breast inward toward   the nipple. This encourages milk flow. If your milk flows slowly, you may need to continue this action during the feeding.  Support your breast with 4 fingers underneath and your thumb above your nipple (make the letter "C" with your hand). Make sure your fingers are well away from your nipple and your baby's mouth.  Stroke your baby's lips gently with your finger or nipple.  When your baby's mouth is open wide enough, quickly bring your baby to your breast, placing your entire nipple and as much of the areola as possible into your baby's mouth. The areola is the colored area around your nipple. ? More areola should be visible above your baby's upper lip than below the lower lip. ? Your baby's lips should be opened and extended outward (flanged) to ensure an adequate, comfortable latch. ? Your baby's tongue should be between his or her lower gum and your breast.  Make sure that your baby's mouth is correctly positioned around your nipple (latched). Your baby's lips should create a seal on your breast and be turned out (everted).  It is common for your baby to suck about 2-3 minutes in order to start the flow of breast milk. Latching Teaching your baby how to latch onto your breast properly is very important. An improper latch can cause nipple pain, decreased milk supply, and poor weight gain in your baby. Also, if your baby is not latched onto your nipple properly, he or she may swallow some air during feeding. This can make your baby fussy. Burping your baby when you switch breasts during the feeding can help to get rid of the air. However, teaching your baby to latch on properly is still the best way to prevent fussiness from swallowing air while breastfeeding. Signs that your baby has successfully  latched onto your nipple  Silent tugging or silent sucking, without causing you pain. Infant's lips should be extended outward (flanged).  Swallowing heard between every 3-4 sucks once your milk has started to flow (after your let-down milk reflex occurs).  Muscle movement above and in front of his or her ears while sucking. Signs that your baby has not successfully latched onto your nipple  Sucking sounds or smacking sounds from your baby while breastfeeding.  Nipple pain. If you think your baby has not latched on correctly, slip your finger into the corner of your baby's mouth to break the suction and place it between your baby's gums. Attempt to start breastfeeding again. Signs of successful breastfeeding Signs from your baby  Your baby will gradually decrease the number of sucks or will completely stop sucking.  Your baby will fall asleep.  Your baby's body will relax.  Your baby will retain a small amount of milk in his or her mouth.  Your baby will let go of your breast by himself or herself. Signs from you  Breasts that have increased in firmness, weight, and size 1-3 hours after feeding.  Breasts that are softer immediately after breastfeeding.  Increased milk volume, as well as a change in milk consistency and color by the fifth day of breastfeeding.  Nipples that are not sore, cracked, or bleeding. Signs that your baby is getting enough milk  Wetting at least 1-2 diapers during the first 24 hours after birth.  Wetting at least 5-6 diapers every 24 hours for the first week after birth. The urine should be clear or pale yellow by the age of 5 days.  Wetting 6-8 diapers every 24 hours as   your baby continues to grow and develop.  At least 3 stools in a 24-hour period by the age of 5 days. The stool should be soft and yellow.  At least 3 stools in a 24-hour period by the age of 7 days. The stool should be seedy and yellow.  No loss of weight greater than 10% of  birth weight during the first 3 days of life.  Average weight gain of 4-7 oz (113-198 g) per week after the age of 4 days.  Consistent daily weight gain by the age of 5 days, without weight loss after the age of 2 weeks. After a feeding, your baby may spit up a small amount of milk. This is normal. Breastfeeding frequency and duration Frequent feeding will help you make more milk and can prevent sore nipples and extremely full breasts (breast engorgement). Breastfeed when you feel the need to reduce the fullness of your breasts or when your baby shows signs of hunger. This is called "breastfeeding on demand." Signs that your baby is hungry include:  Increased alertness, activity, or restlessness.  Movement of the head from side to side.  Opening of the mouth when the corner of the mouth or cheek is stroked (rooting).  Increased sucking sounds, smacking lips, cooing, sighing, or squeaking.  Hand-to-mouth movements and sucking on fingers or hands.  Fussing or crying. Avoid introducing a pacifier to your baby in the first 4-6 weeks after your baby is born. After this time, you may choose to use a pacifier. Research has shown that pacifier use during the first year of a baby's life decreases the risk of sudden infant death syndrome (SIDS). Allow your baby to feed on each breast as long as he or she wants. When your baby unlatches or falls asleep while feeding from the first breast, offer the second breast. Because newborns are often sleepy in the first few weeks of life, you may need to awaken your baby to get him or her to feed. Breastfeeding times will vary from baby to baby. However, the following rules can serve as a guide to help you make sure that your baby is properly fed:  Newborns (babies 4 weeks of age or younger) may breastfeed every 1-3 hours.  Newborns should not go without breastfeeding for longer than 3 hours during the day or 5 hours during the night.  You should breastfeed  your baby a minimum of 8 times in a 24-hour period. Breast milk pumping     Pumping and storing breast milk allows you to make sure that your baby is exclusively fed your breast milk, even at times when you are unable to breastfeed. This is especially important if you go back to work while you are still breastfeeding, or if you are not able to be present during feedings. Your lactation consultant can help you find a method of pumping that works best for you and give you guidelines about how long it is safe to store breast milk. Caring for your breasts while you breastfeed Nipples can become dry, cracked, and sore while breastfeeding. The following recommendations can help keep your breasts moisturized and healthy:  Avoid using soap on your nipples.  Wear a supportive bra designed especially for nursing. Avoid wearing underwire-style bras or extremely tight bras (sports bras).  Air-dry your nipples for 3-4 minutes after each feeding.  Use only cotton bra pads to absorb leaked breast milk. Leaking of breast milk between feedings is normal.  Use lanolin on your nipples   after breastfeeding. Lanolin helps to maintain your skin's normal moisture barrier. Pure lanolin is not harmful (not toxic) to your baby. You may also hand express a few drops of breast milk and gently massage that milk into your nipples and allow the milk to air-dry. In the first few weeks after giving birth, some women experience breast engorgement. Engorgement can make your breasts feel heavy, warm, and tender to the touch. Engorgement peaks within 3-5 days after you give birth. The following recommendations can help to ease engorgement:  Completely empty your breasts while breastfeeding or pumping. You may want to start by applying warm, moist heat (in the shower or with warm, water-soaked hand towels) just before feeding or pumping. This increases circulation and helps the milk flow. If your baby does not completely empty your  breasts while breastfeeding, pump any extra milk after he or she is finished.  Apply ice packs to your breasts immediately after breastfeeding or pumping, unless this is too uncomfortable for you. To do this: ? Put ice in a plastic bag. ? Place a towel between your skin and the bag. ? Leave the ice on for 20 minutes, 2-3 times a day.  Make sure that your baby is latched on and positioned properly while breastfeeding. If engorgement persists after 48 hours of following these recommendations, contact your health care provider or a lactation consultant. Overall health care recommendations while breastfeeding  Eat 3 healthy meals and 3 snacks every day. Well-nourished mothers who are breastfeeding need an additional 450-500 calories a day. You can meet this requirement by increasing the amount of a balanced diet that you eat.  Drink enough water to keep your urine pale yellow or clear.  Rest often, relax, and continue to take your prenatal vitamins to prevent fatigue, stress, and low vitamin and mineral levels in your body (nutrient deficiencies).  Do not use any products that contain nicotine or tobacco, such as cigarettes and e-cigarettes. Your baby may be harmed by chemicals from cigarettes that pass into breast milk and exposure to secondhand smoke. If you need help quitting, ask your health care provider.  Avoid alcohol.  Do not use illegal drugs or marijuana.  Talk with your health care provider before taking any medicines. These include over-the-counter and prescription medicines as well as vitamins and herbal supplements. Some medicines that may be harmful to your baby can pass through breast milk.  It is possible to become pregnant while breastfeeding. If birth control is desired, ask your health care provider about options that will be safe while breastfeeding your baby. Where to find more information: [employer]: www.llli.org Contact a health care provider  if:  You feel like you want to stop breastfeeding or have become frustrated with breastfeeding.  Your nipples are cracked or bleeding.  Your breasts are red, tender, or warm.  You have: ? Painful breasts or nipples. ? A swollen area on either breast. ? A fever or chills. ? Nausea or vomiting. ? Drainage other than breast milk from your nipples.  Your breasts do not become full before feedings by the fifth day after you give birth.  You feel sad and depressed.  Your baby is: ? Too sleepy to eat well. ? Having trouble sleeping. ? More than 1 week old and wetting fewer than 6 diapers in a 24-hour period. ? Not gaining weight by 5 days of age.  Your baby has fewer than 3 stools in a 24-hour period.  Your baby's skin or   the white parts of his or her eyes become yellow. Get help right away if:  Your baby is overly tired (lethargic) and does not want to wake up and feed.  Your baby develops an unexplained fever. Summary  Breastfeeding offers many health benefits for infant and mothers.  Try to breastfeed your infant when he or she shows early signs of hunger.  Gently tickle or stroke your baby's lips with your finger or nipple to allow the baby to open his or her mouth. Bring the baby to your breast. Make sure that much of the areola is in your baby's mouth. Offer one side and burp the baby before you offer the other side.  Talk with your health care provider or lactation consultant if you have questions or you face problems as you breastfeed. This information is not intended to replace advice given to you by your health care provider. Make sure you discuss any questions you have with your health care provider. Document Revised: 02/06/2018 Document Reviewed: 12/14/2016 Elsevier Patient Education  2020 [employer] Inc.  

## 2020-03-30 NOTE — Progress Notes (Signed)
   Subjective:    Diane Snyder is a [id number] [redacted]w[redacted]d being seen today for her first obstetrical visit.  Her obstetrical history is significant for advanced maternal age, obesity and pregnancy induced hypertension. Patient does intend to breast feed. Pregnancy history fully reviewed.  Patient reports nausea and vomiting.  Vitals:   03/30/20 1309  BP: 120/83  Pulse: 94  Weight: 205 lb (93 kg)    HISTORY: OB History  Gravida Para Term Preterm AB Living  3 2 2     2   SAB TAB Ectopic Multiple Live Births          2    # Outcome Date GA Lbr Len/2nd Weight Sex Delivery Anes PTL Lv  3 Current           2 Term 02/16/08 [redacted]w[redacted]d  7 lb 12 oz (3.515 kg) F Vag-Spont None  LIV  1 Term 10/11/06 [redacted]w[redacted]d  7 lb 11 oz (3.487 kg) M Vag-Spont EPI  LIV   Past Medical History:  Diagnosis Date  . Diverticulosis    Past Surgical History:  Procedure Laterality Date  . CHOLECYSTECTOMY    . cholestectomy      Family History  Problem Relation Age of Onset  . Hypertension Mother   . Heart disease Maternal Grandmother   . Hypertension Maternal Grandmother   . Diabetes Maternal Grandmother   . Stroke Maternal Grandfather   . Heart disease Maternal Grandfather   . Heart attack Maternal Grandfather      Exam    Uterus:     Pelvic Exam:    Perineum: Normal Perineum   Vulva: normal   Vagina:  normal mucosa, normal discharge   pH:    Cervix: multiparous appearance and cervix is closed and long   Adnexa: normal adnexa and no mass, fullness, tenderness   Bony Pelvis: gynecoid  System: Breast:  normal appearance, no masses or tenderness   Skin: normal coloration and turgor, no rashes    Neurologic: oriented, no focal deficits   Extremities: normal strength, tone, and muscle mass   HEENT extra ocular movement intact   Mouth/Teeth mucous membranes moist, pharynx normal without lesions and dental hygiene good   Neck supple and no masses   Cardiovascular: regular rate and rhythm   Respiratory:   appears well, vitals normal, no respiratory distress, acyanotic, normal RR, chest clear, no wheezing, crepitations, rhonchi, normal symmetric air entry   Abdomen: soft, non-tender; bowel sounds normal; no masses,  no organomegaly   Urinary:       Assessment:    Pregnancy: [id number] Patient Active Problem List   Diagnosis Date Noted  . Supervision of other normal pregnancy, antepartum 03/30/2020  . AMA (advanced maternal age) multigravida 35+ 03/30/2020  . Maternal obesity affecting pregnancy, antepartum 03/30/2020  . History of gestational hypertension 03/30/2020  . Chest pain 12/03/2017        Plan:     Initial labs drawn. Prenatal vitamins. Problem list reviewed and updated. Genetic Screening discussed : panorama ordered.  Ultrasound discussed; fetal survey: ordered. Baseline labs and A1c Rx ASA and diclegis provided  Follow up in 4 weeks. 50% of 30 min visit spent on counseling and coordination of care.     Roshunda Keir 03/30/2020

## 2020-03-30 NOTE — Addendum Note (Signed)
Addended by: Tristan Schroeder D on: 03/30/2020 01:58 PM   Modules accepted: Orders

## 2020-03-31 LAB — OBSTETRIC PANEL, INCLUDING HIV
Antibody Screen: NEGATIVE
Basophils Absolute: 0 10*3/uL (ref 0.0–0.2)
Basos: 0 %
EOS (ABSOLUTE): 0.2 10*3/uL (ref 0.0–0.4)
Eos: 2 %
HIV Screen 4th Generation wRfx: NONREACTIVE
Hematocrit: 35.3 % (ref 34.0–46.6)
Hemoglobin: 11.5 g/dL (ref 11.1–15.9)
Hepatitis B Surface Ag: NEGATIVE
Immature Grans (Abs): 0 10*3/uL (ref 0.0–0.1)
Immature Granulocytes: 0 %
Lymphocytes Absolute: 1.8 10*3/uL (ref 0.7–3.1)
Lymphs: 20 %
MCH: 26.3 pg — ABNORMAL LOW (ref 26.6–33.0)
MCHC: 32.6 g/dL (ref 31.5–35.7)
MCV: 81 fL (ref 79–97)
Monocytes Absolute: 0.5 10*3/uL (ref 0.1–0.9)
Monocytes: 6 %
Neutrophils Absolute: 6.2 10*3/uL (ref 1.4–7.0)
Neutrophils: 72 %
Platelets: 273 10*3/uL (ref 150–450)
RBC: 4.37 x10E6/uL (ref 3.77–5.28)
RDW: 13.5 % (ref 11.7–15.4)
RPR Ser Ql: NONREACTIVE
Rh Factor: POSITIVE
Rubella Antibodies, IGG: 1.92 index (ref >0.99)
WBC: 8.7 10*3/uL (ref 3.4–10.8)

## 2020-03-31 LAB — COMPREHENSIVE METABOLIC PANEL
ALT: 28 IU/L (ref 0–32)
AST: 23 IU/L (ref 0–40)
Albumin/Globulin Ratio: 1.3 (ref 1.2–2.2)
Albumin: 4 g/dL (ref 3.8–4.8)
Alkaline Phosphatase: 77 IU/L (ref 39–117)
BUN/Creatinine Ratio: 10 (ref 9–23)
BUN: 6 mg/dL (ref 6–20)
Bilirubin Total: 0.3 mg/dL (ref 0.0–1.2)
CO2: 19 mmol/L — ABNORMAL LOW (ref 20–29)
Calcium: 9.6 mg/dL (ref 8.7–10.2)
Chloride: 102 mmol/L (ref 96–106)
Creatinine, Ser: 0.63 mg/dL (ref 0.57–1.00)
GFR calc Af Amer: 133 mL/min/{1.73_m2} (ref >59)
GFR calc non Af Amer: 116 mL/min/{1.73_m2} (ref >59)
Globulin, Total: 3.2 g/dL (ref 1.5–4.5)
Glucose: 100 mg/dL — ABNORMAL HIGH (ref 65–99)
Potassium: 3.9 mmol/L (ref 3.5–5.2)
Sodium: 137 mmol/L (ref 134–144)
Total Protein: 7.2 g/dL (ref 6.0–8.5)

## 2020-03-31 LAB — HEMOGLOBIN A1C
Est. average glucose Bld gHb Est-mCnc: 137 mg/dL
Hgb A1c MFr Bld: 6.4 % — ABNORMAL HIGH (ref 4.8–5.6)

## 2020-03-31 LAB — PROTEIN / CREATININE RATIO, URINE
Creatinine, Urine: 291.5 mg/dL
Protein, Ur: 23.1 mg/dL
Protein/Creat Ratio: 79 mg/g creat (ref 0–200)

## 2020-04-01 LAB — URINE CULTURE, OB REFLEX

## 2020-04-01 LAB — CULTURE, OB URINE

## 2020-04-08 ENCOUNTER — Other Ambulatory Visit: Payer: 59

## 2020-04-08 ENCOUNTER — Other Ambulatory Visit: Payer: Self-pay

## 2020-04-08 ENCOUNTER — Encounter: Payer: Self-pay | Admitting: Obstetrics and Gynecology

## 2020-04-08 DIAGNOSIS — Z3480 Encounter for supervision of other normal pregnancy, unspecified trimester: Secondary | ICD-10-CM

## 2020-04-09 LAB — CBC
Hematocrit: 35.2 % (ref 34.0–46.6)
Hemoglobin: 11.6 g/dL (ref 11.1–15.9)
MCH: 26.6 pg (ref 26.6–33.0)
MCHC: 33 g/dL (ref 31.5–35.7)
MCV: 81 fL (ref 79–97)
Platelets: 269 10*3/uL (ref 150–450)
RBC: 4.36 x10E6/uL (ref 3.77–5.28)
RDW: 13.1 % (ref 11.7–15.4)
WBC: 7.7 10*3/uL (ref 3.4–10.8)

## 2020-04-09 LAB — HIV ANTIBODY (ROUTINE TESTING W REFLEX): HIV Screen 4th Generation wRfx: NONREACTIVE

## 2020-04-09 LAB — GLUCOSE TOLERANCE, 2 HOURS W/ 1HR
Glucose, 1 hour: 239 mg/dL — ABNORMAL HIGH (ref 65–179)
Glucose, 2 hour: 217 mg/dL — ABNORMAL HIGH (ref 65–152)
Glucose, Fasting: 110 mg/dL — ABNORMAL HIGH (ref 65–91)

## 2020-04-09 LAB — RPR: RPR Ser Ql: NONREACTIVE

## 2020-04-10 ENCOUNTER — Other Ambulatory Visit: Payer: Self-pay | Admitting: Obstetrics and Gynecology

## 2020-04-10 DIAGNOSIS — O24919 Unspecified diabetes mellitus in pregnancy, unspecified trimester: Secondary | ICD-10-CM | POA: Insufficient documentation

## 2020-04-10 DIAGNOSIS — O24419 Gestational diabetes mellitus in pregnancy, unspecified control: Secondary | ICD-10-CM

## 2020-04-11 ENCOUNTER — Encounter: Payer: Self-pay | Admitting: Obstetrics and Gynecology

## 2020-04-11 DIAGNOSIS — O280 Abnormal hematological finding on antenatal screening of mother: Secondary | ICD-10-CM

## 2020-04-13 ENCOUNTER — Telehealth: Payer: Self-pay

## 2020-04-13 MED ORDER — ACCU-CHEK SOFTCLIX LANCETS MISC
12 refills | Status: DC
Start: 2020-04-13 — End: 2020-10-04

## 2020-04-13 MED ORDER — ACCU-CHEK GUIDE W/DEVICE KIT
1.0000 | PACK | Freq: Four times a day (QID) | 0 refills | Status: DC
Start: 2020-04-13 — End: 2020-10-04

## 2020-04-13 MED ORDER — GLUCOSE BLOOD VI STRP: ORAL_STRIP | 12 refills | Status: DC

## 2020-04-13 NOTE — Telephone Encounter (Signed)
S/w pt and advised of GTT & genetic results, supplies sent to pharmacy, and appts scheduled.

## 2020-04-20 ENCOUNTER — Other Ambulatory Visit: Payer: Self-pay

## 2020-04-20 ENCOUNTER — Encounter: Payer: 59 | Attending: Obstetrics and Gynecology | Admitting: Dietician

## 2020-04-20 DIAGNOSIS — Z8759 Personal history of other complications of pregnancy, childbirth and the puerperium: Secondary | ICD-10-CM | POA: Diagnosis present

## 2020-04-22 ENCOUNTER — Encounter: Payer: Self-pay | Admitting: Dietician

## 2020-04-22 NOTE — Progress Notes (Signed)
  Patient was seen on 04/20/2020 for Gestational Diabetes self-management class at the [facility]. The following learning objectives were met by the patient during this course:   States the definition of Gestational Diabetes  States why dietary management is important in controlling blood glucose  Describes the effects each nutrient has on blood glucose levels  Demonstrates ability to create a balanced meal plan  Demonstrates carbohydrate counting   States when to check blood glucose levels  Demonstrates proper blood glucose monitoring techniques  States the effect of stress and exercise on blood glucose levels  States the importance of limiting caffeine and abstaining from alcohol and smoking  Blood glucose monitor given: non Patient has blood glucose meter.  Blood glucose 99 during class  Patient instructed to monitor glucose levels: FBS: 60 - <90 1 hour: <140 2 hour: <120  *Patient received handouts:  Nutrition Diabetes and Pregnancy  Carbohydrate Counting List  Patient will be seen for follow-up as needed.

## 2020-04-27 ENCOUNTER — Ambulatory Visit (INDEPENDENT_AMBULATORY_CARE_PROVIDER_SITE_OTHER): Payer: 59 | Admitting: Advanced Practice Midwife

## 2020-04-27 ENCOUNTER — Encounter: Payer: Self-pay | Admitting: Advanced Practice Midwife

## 2020-04-27 ENCOUNTER — Other Ambulatory Visit: Payer: Self-pay | Admitting: Advanced Practice Midwife

## 2020-04-27 ENCOUNTER — Other Ambulatory Visit: Payer: Self-pay

## 2020-04-27 DIAGNOSIS — Z3A16 16 weeks gestation of pregnancy: Secondary | ICD-10-CM

## 2020-04-27 DIAGNOSIS — Z3480 Encounter for supervision of other normal pregnancy, unspecified trimester: Secondary | ICD-10-CM

## 2020-04-27 MED ORDER — METFORMIN HCL 500 MG PO TABS: 500.0000 mg | ORAL_TABLET | Freq: Every evening | ORAL | 1 refills | Status: DC

## 2020-04-27 NOTE — Progress Notes (Signed)
ROB/AFP.  C/o pelvic pressure.  Genetic Counseling is scheduled for 05/16/2020.

## 2020-04-27 NOTE — Progress Notes (Signed)
   PRENATAL VISIT NOTE  Subjective:  Diane Snyder is a 39 y.o. G3P2002 at [redacted]w[redacted]d being seen today for ongoing prenatal care.  She is currently monitored for the following issues for this high-risk pregnancy and has Chest pain; Supervision of other normal pregnancy, antepartum; AMA (advanced maternal age) multigravida 58+; Maternal obesity affecting pregnancy, antepartum; History of gestational hypertension; and Diabetes mellitus affecting pregnancy on their problem list.  Patient reports no complaints.  Contractions: Not present. Vag. Bleeding: None.  Movement: Present. Denies leaking of fluid.   The following portions of the patient's history were reviewed and updated as appropriate: allergies, current medications, past family history, past medical history, past social history, past surgical history and problem list.   Objective:   Vitals:   04/27/20 0919  BP: 123/78  Pulse: (!) 109  Weight: 199 lb 14.4 oz (90.7 kg)    Fetal Status: Fetal Heart Rate (bpm): 156   Movement: Present     General:  Alert, oriented and cooperative. Patient is in no acute distress.  Skin: Skin is warm and dry. No rash noted.   Cardiovascular: Normal heart rate noted  Respiratory: Normal respiratory effort, no problems with respiration noted  Abdomen: Soft, gravid, appropriate for gestational age.  Pain/Pressure: Present     Pelvic: Cervical exam deferred        Extremities: Normal range of motion.  Edema: None  Mental Status: Normal mood and affect. Normal behavior. Normal judgment and thought content.    Blood Glucose: 5/20  fasting 128 PP118 5/21 Fasting 114 PP 130 5/22 Fasting 119 PP 135, 141 5/25 PP 126, 121 5/24 PP 115, 120 5/23 PP 115 5/26 PP 115, 99, 124 5/27 Fasting 113 PP 122, 147 5/28 Fasting 109 PP 111 5/29 Fasting 109 PP 128 5/30 Fasting 114 6/1 PP 116 6/2 Fasting 120  DW Dr. Rip Harbour will add metformin 500mg  qhs    Assessment and Plan:  Pregnancy: G3P2002  at [redacted]w[redacted]d 1. Supervision of other normal pregnancy, antepartum - AFP, Serum, Open Spina Bifida - Hepatitis C Antibody - RX: Metformin 500mg  qhs   Preterm labor symptoms and general obstetric precautions including but not limited to vaginal bleeding, contractions, leaking of fluid and fetal movement were reviewed in detail with the patient. Please refer to After Visit Summary for other counseling recommendations.   Return in about 2 weeks (around 05/11/2020) for In person visit .  Future Appointments  Date Time Provider [place]  05/13/2020 11:00 AM [facility] NURSE [facility]  05/13/2020 11:00 AM [facility] US1 [facility]  05/16/2020  9:00 AM [facility] GENETIC COUNSELING RM [facility] [place] DNP, CNM  04/27/20  10:11 AM

## 2020-04-29 LAB — AFP, SERUM, OPEN SPINA BIFIDA
AFP MoM: 1.26
AFP Value: 41.8 ng/mL
Gest. Age on Collection Date: 16.5 weeks
Maternal Age At EDD: 37.3 yr
OSBR Risk 1 IN: 10000
Test Results:: NEGATIVE
Weight: 205 [lb_av]

## 2020-04-29 LAB — HEPATITIS C ANTIBODY: Hep C Virus Ab: <0.1 s/co ratio (ref 0.0–0.9)

## 2020-05-11 ENCOUNTER — Ambulatory Visit (INDEPENDENT_AMBULATORY_CARE_PROVIDER_SITE_OTHER): Payer: 59 | Admitting: Certified Nurse Midwife

## 2020-05-11 ENCOUNTER — Encounter: Payer: Self-pay | Admitting: Certified Nurse Midwife

## 2020-05-11 ENCOUNTER — Other Ambulatory Visit: Payer: Self-pay

## 2020-05-11 VITALS — BP 123/86 | HR 99 | Wt 196.7 lb

## 2020-05-11 DIAGNOSIS — O99210 Obesity complicating pregnancy, unspecified trimester: Secondary | ICD-10-CM

## 2020-05-11 DIAGNOSIS — Z3A18 18 weeks gestation of pregnancy: Secondary | ICD-10-CM

## 2020-05-11 DIAGNOSIS — Z3480 Encounter for supervision of other normal pregnancy, unspecified trimester: Secondary | ICD-10-CM

## 2020-05-11 DIAGNOSIS — O24912 Unspecified diabetes mellitus in pregnancy, second trimester: Secondary | ICD-10-CM

## 2020-05-11 DIAGNOSIS — Z8759 Personal history of other complications of pregnancy, childbirth and the puerperium: Secondary | ICD-10-CM

## 2020-05-11 DIAGNOSIS — O99212 Obesity complicating pregnancy, second trimester: Secondary | ICD-10-CM

## 2020-05-11 DIAGNOSIS — O09522 Supervision of elderly multigravida, second trimester: Secondary | ICD-10-CM

## 2020-05-11 DIAGNOSIS — Z7984 Long term (current) use of oral hypoglycemic drugs: Secondary | ICD-10-CM

## 2020-05-11 DIAGNOSIS — E669 Obesity, unspecified: Secondary | ICD-10-CM

## 2020-05-11 MED ORDER — METFORMIN HCL ER 500 MG PO TB24: 1000.0000 mg | ORAL_TABLET | Freq: Every day | ORAL | 3 refills | Status: DC

## 2020-05-11 NOTE — Progress Notes (Signed)
PRENATAL VISIT NOTE  Subjective:  Diane Snyder is a 39 y.o. G3P2002 at [redacted]w[redacted]d being seen today for ongoing prenatal care.  She is currently monitored for the following issues for this high-risk pregnancy and has Chest pain; Supervision of other normal pregnancy, antepartum; AMA (advanced maternal age) multigravida 70+; Maternal obesity affecting pregnancy, antepartum; History of gestational hypertension; and Diabetes mellitus affecting pregnancy on their problem list.  Patient reports sharp shooting pain in back down through thigh.  Contractions: Not present. Vag. Bleeding: None.  Movement: Present. Denies leaking of fluid.   The following portions of the patient's history were reviewed and updated as appropriate: allergies, current medications, past family history, past medical history, past social history, past surgical history and problem list.   Objective:   Vitals:   05/11/20 0923  BP: 123/86  Pulse: 99  Weight: 196 lb 11.2 oz (89.2 kg)    Fetal Status: Fetal Heart Rate (bpm): 150   Movement: Present     General:  Alert, oriented and cooperative. Patient is in no acute distress.  Skin: Skin is warm and dry. No rash noted.   Cardiovascular: Normal heart rate noted  Respiratory: Normal respiratory effort, no problems with respiration noted  Abdomen: Soft, gravid, appropriate for gestational age.  Pain/Pressure: Absent     Pelvic: Cervical exam deferred        Extremities: Normal range of motion.  Edema: None  Mental Status: Normal mood and affect. Normal behavior. Normal judgment and thought content.   Assessment and Plan:  Pregnancy: G3P2002 at [redacted]w[redacted]d 1. Supervision of other normal pregnancy, antepartum - Patient doing well - Patient complaints describes as sciatic nerve pain, educated on use of heat pads and safe medication during pregnancy. Encouraged patient that if pain gets worse then to call and will prescribe maternity support belt  - Routine prenatal care -  Anticipatory guidance on upcoming appointments   2. Multigravida of advanced maternal age in second trimester - 39yo at time of delivery   3. Diabetes mellitus affecting pregnancy in second trimester - Reviewed CBG log over the past 2 weeks  - Fasting range from 97-134- 1000% of fasting levels elevated  - 2hr PP range from 92-120 with 3 elevated levels of [phone] - consulted with Dr Roselie Awkward who recommends increasing metformin dose and bringing patient back in 2 weeks for assessment  - Educated and discussed with patient carb modified diet for DM and increasing protein, discussed with patient that new Rx will be sent to pharmacy for 24hr coverage tablet of metformin, patient verbalizes understanding.  - metFORMIN (GLUCOPHAGE-XR) 500 MG 24 hr tablet; Take 2 tablets (1,000 mg total) by mouth at bedtime.  Dispense: 60 tablet; Refill: 3  4. Maternal obesity affecting pregnancy, antepartum - Educated and discussed weight gain recommendations during pregnancy  - Continue bASA  - Encouraged to stay active and walk at least 30 minutes 3/4 times per week   5. History of gestational hypertension - BP normal today    Preterm labor symptoms and general obstetric precautions including but not limited to vaginal bleeding, contractions, leaking of fluid and fetal movement were reviewed in detail with the patient. Please refer to After Visit Summary for other counseling recommendations.   Return in about 2 weeks (around 05/25/2020) for HROB- in person /check CBG levels.  Future Appointments  Date Time Provider [place]  05/13/2020 11:00 AM [facility] NURSE [facility]  05/13/2020 11:00 AM [facility] US1 [facility]  05/16/2020  9:00 AM [facility]  GENETIC COUNSELING RM [facility]  05/25/2020  9:30 AM Woodroe Mode, MD [place] None    Lajean Manes, CNM

## 2020-05-11 NOTE — Patient Instructions (Signed)

## 2020-05-11 NOTE — Progress Notes (Signed)
Patients presents for ROB. Patient complains of having intermittent left side pain which is relieved with laying down on her right side.

## 2020-05-13 ENCOUNTER — Ambulatory Visit: Payer: 59 | Admitting: *Deleted

## 2020-05-13 ENCOUNTER — Other Ambulatory Visit: Payer: Self-pay

## 2020-05-13 ENCOUNTER — Ambulatory Visit: Payer: 59 | Attending: Obstetrics and Gynecology

## 2020-05-13 ENCOUNTER — Other Ambulatory Visit: Payer: Self-pay | Admitting: Obstetrics and Gynecology

## 2020-05-13 ENCOUNTER — Encounter: Payer: Self-pay | Admitting: Obstetrics & Gynecology

## 2020-05-13 DIAGNOSIS — E669 Obesity, unspecified: Secondary | ICD-10-CM

## 2020-05-13 DIAGNOSIS — Z148 Genetic carrier of other disease: Secondary | ICD-10-CM

## 2020-05-13 DIAGNOSIS — O09522 Supervision of elderly multigravida, second trimester: Secondary | ICD-10-CM

## 2020-05-13 DIAGNOSIS — O24415 Gestational diabetes mellitus in pregnancy, controlled by oral hypoglycemic drugs: Secondary | ICD-10-CM

## 2020-05-13 DIAGNOSIS — Z3A18 18 weeks gestation of pregnancy: Secondary | ICD-10-CM

## 2020-05-13 DIAGNOSIS — O09292 Supervision of pregnancy with other poor reproductive or obstetric history, second trimester: Secondary | ICD-10-CM | POA: Diagnosis not present

## 2020-05-13 DIAGNOSIS — O4412 Placenta previa with hemorrhage, second trimester: Secondary | ICD-10-CM

## 2020-05-13 DIAGNOSIS — O99210 Obesity complicating pregnancy, unspecified trimester: Secondary | ICD-10-CM

## 2020-05-13 DIAGNOSIS — O99212 Obesity complicating pregnancy, second trimester: Secondary | ICD-10-CM

## 2020-05-13 DIAGNOSIS — Z8759 Personal history of other complications of pregnancy, childbirth and the puerperium: Secondary | ICD-10-CM | POA: Diagnosis present

## 2020-05-13 DIAGNOSIS — O4400 Placenta previa specified as without hemorrhage, unspecified trimester: Secondary | ICD-10-CM | POA: Insufficient documentation

## 2020-05-16 ENCOUNTER — Ambulatory Visit: Payer: 59

## 2020-05-16 ENCOUNTER — Other Ambulatory Visit: Payer: Self-pay | Admitting: *Deleted

## 2020-05-16 DIAGNOSIS — O24319 Unspecified pre-existing diabetes mellitus in pregnancy, unspecified trimester: Secondary | ICD-10-CM

## 2020-05-16 DIAGNOSIS — Z7984 Long term (current) use of oral hypoglycemic drugs: Secondary | ICD-10-CM

## 2020-05-17 ENCOUNTER — Other Ambulatory Visit: Payer: Self-pay

## 2020-05-17 ENCOUNTER — Ambulatory Visit: Payer: Self-pay

## 2020-05-17 ENCOUNTER — Ambulatory Visit: Payer: 59 | Attending: Obstetrics and Gynecology | Admitting: Genetic Counselor

## 2020-05-17 DIAGNOSIS — Z148 Genetic carrier of other disease: Secondary | ICD-10-CM

## 2020-05-17 DIAGNOSIS — Z315 Encounter for genetic counseling: Secondary | ICD-10-CM

## 2020-05-17 DIAGNOSIS — Z3A19 19 weeks gestation of pregnancy: Secondary | ICD-10-CM

## 2020-05-17 DIAGNOSIS — O352XX0 Maternal care for (suspected) hereditary disease in fetus, not applicable or unspecified: Secondary | ICD-10-CM

## 2020-05-17 DIAGNOSIS — D563 Thalassemia minor: Secondary | ICD-10-CM

## 2020-05-17 NOTE — Progress Notes (Signed)
05/17/2020  Diane Snyder MRN: [id number] DOV: 05/17/2020   I connected [email or web address]/22/21at9:15 AMESTbyWebExand verified that I am speaking with the correct person using two identifiers.[name] to the [facility] for a genetics consultation regardingher carrier status for alpha-thalassemia and Gaucher disease. Diane Snyder presented to her appointment alone.   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 female. Her current pregnancy has completed [redacted]w[redacted]d (Estimated Date of Delivery: 10/08/20). Diane Snyder and her partner have a 70 year old son and a 51 year old daughter.   Diane Snyder denied exposure to environmental toxins or chemical agents. She denied the use of alcohol, tobacco or street drugs. She reported taking Metformin and blood pressure medications. She denied significant viral illnesses, fevers, and bleeding during the course of her pregnancy. Diane Snyder has diabetes and a history of gestational hypertension. Her medical and surgical histories were otherwise noncontributory.  Family History  A three generation pedigree was drafted and reviewed. The family history is remarkable for the following:  - Diane Snyder's son reportedly has learning difficulties. Diane Snyder also has a maternal uncle who is reportedly "slow". He is able to work outside of the home 2-3 days out of the week. We discussed that many times, learning difficulties are multifactorial in nature, occurring due to a combination of genetic and environmental factors that are difficult to identify. Learning difficulties can appear to run in families; thus, there is a chance that the couple's other children could also experience learning difficulties of some kind. Diane Snyder understands that she should make the pediatrician aware of  any concerns she has about her children's development.  The remaining family histories were reviewed and found to be noncontributory for birth defects, intellectual disability, recurrent pregnancy loss, and known genetic conditions. Diane Snyder had limited information about parts of her partner's family history; thus, risk assessment was limited.  The patient's ethnicity is African American. The father of the pregnancy's ethnicity is African American. Ashkenazi Jewish ancestry and consanguinity were denied. Pedigree will be scanned under Media.  Discussion  Alpha-thalassemia:  Diane Snyder had [id number] carrier screening performed through [place]. The results of the screen identified her as a silent carrier for alpha-thalassemia (aa/a-). Alpha-thalassemia is different in its inheritance compared to other hemoglobinopathies as there are two copies of two alpha globin genes (HBA1 and HBA2) on each chromosome 16, or four alpha globin genes total (aa/aa). A person can be a carrier of one alpha gene mutation (aa/a-), also referred to as a "silent carrier". A person who carries two alpha globin gene mutations can either carry them in cis (both on the same chromosome, denoted as aa/--) or in trans (on different chromosomes, denoted as a-/a-). Alpha-thalassemia carriers of two mutations who have African American ancestry are more likely to have a trans arrangement (a-/a-); cis configuration is reported to be rare in individuals with African American ancestry.     There are several different forms of alpha-thalassemia. The most severe form of alpha-thalassemia, Hb Barts, is associated with an absence of alpha globin chain synthesis as a result of deletions of all four alpha globin genes (--/--).  Given that Diane Snyder is a silent carrier (aa/a-), her pregnancies would not be at increased risk for Hb Barts, even if her partner is a carrier for alpha-thalassemia, as she will always pass on at least one copy of  the  alpha globin gene to her children. Hemoglobin H (HbH) disease is caused by three deleted or dysfunctioning alpha globin alleles (a-/--) and is characterized by microcytic hypochromic hemolytic anemia, hepatosplenomegaly, mild jaundice, growth retardation, and sometimes thalassemia-like bone changes. Given Diane Snyder's silent carrier status (aa/a-), the current fetus would only be at risk for HbH disease (a-/--), if her partner is a carrier for two alpha globin mutations in cis (aa/--). If this is the case, the risk for HbH disease in the pregnancy would be 1 in 4 (25%). However, if Diane Snyder's partner is a carrier for two alpha globin mutations, he would be more likely to carry them in trans configuration (a-/a-) than the cis configuration (aa/--), given his ethnicity. If he is a carrier of alpha-thalassemia in trans, then the pregnancy would not be at increased risk for HbH disease. Based on the carrier frequency for alpha-thalassemia in the African American population, Diane Snyder's partner has a 1 in 30 chance of being any type of carrier for alpha-thalassemia.   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 Gaucher disease is a condition that is caused by a buildup of a fatty substance called glucocerebroside in certain organs, particularly the spleen and liver. The signs and symptoms of Gaucher disease vary widely among affected individuals. Several types of Gaucher disease have been identified based on their characteristic features, with type I being most common. Type I Gaucher disease is characterized by hepatosplenomegaly, anemia, easy bruising, thrombocytopenia, lung disease, bone pain, fractures, and arthritis, with sparing of the brain and spinal cord. Symptoms may be mild to severe and may present anytime from childhood to adulthood. Generally, individuals with type I Gaucher disease have a normal lifespan and excellent  quality of life with treatment via enzyme replacement therapy or substrate reduction therapy. Type II and type III Gaucher disease are characterized by all of the same symptoms as type I, with the addition of central nervous system symptoms (abnormal eye movements, seizures, and brain damage). Symptoms typically appear in infancy for individuals affected by type II disease. Type II disease is usually fatal within the first two years of life. It is currently untreatable due to the severe, irreversible brain damage caused by the disease. Type III disease has a severity between types I and II. Some individuals with type III disease can live into their 34s with treatment. Other rarer forms of Gaucher disease, such as a perinatal lethal form and cardiovascular form have also been described.    Gaucher disease is caused by pathogenic variants in the GBA gene. This gene provides instructions for making an enzyme called beta-glucocerebrosidase. The beta-glucocerebrosidase enzyme is active in cellular structures called lysosomes, which use digestive enzymes to break down toxic substances in the body. The beta-glucocerebrosidase enzyme is required to metabolize glucocerebroside and break it down into two different components, glucose and ceramide (a simpler fat molecule). Pathogenic variants in the GBA gene cause a deficiency of the beta-glucocerebrosidase enzyme. This leads to a buildup of glucocerebroside in toxic levels within various tissues and organs in the body, especially the liver and spleen.   Gaucher disease is inherited in an autosomal recessive fashion, meaning the current fetus is only at risk for Gaucher disease if Diane Snyder's partner is also a carrier for the condition. The pan-ethnic carrier frequency for Gaucher disease is 1 in 158. Based on the fact that the couple has two healthy children together, their chance of having a  child with Gaucher disease is (07/2520)(1/2)(1/2) = 9/10084 (0.09%). If Ms.  Snyder's partner is also a carrier, the chance for Gaucher disease in the current fetus would be 1 in 4 (25%).   Specific variants in the GBA gene may also be a risk factor for developing Parkinson's disease in otherwise healthy carriers. Parkinson's disease is a progressive nervous system disorder that affects movement. Tremors are common in individuals with Parkinson's disease, but the disorder can also cause stiffness, slowing of movement (bradykinesia), rigid muscles, impaired posture and balance, and loss of automatic movements. Parkinson's disease can also cause speech and writing changes. Individuals who are carriers for Gaucher disease have approximately a 3-9% risk of developing Parkinson's disease compared to the 2-4% general population risk (Landfall, 2019). Age of onset of Parkinson's disease can be earlier and progression can be more rapid for individuals who are carriers of Gaucher disease. GBA variants demonstrate reduced penetrance, meaning not every carrier of Gaucher disease will develop Parkinson's disease. I encouraged Ms. Seelye to notify her primary care provider of her carrier status and seek a work-up with neurology if she begins demonstrating signs of Parkinson disease in the future.   It is recommended that Ms. Fargo inform her siblings about her carrier status for Gaucher disease, as her siblings have a 50% chance of also being a carrier. If her siblings are also a carrier for Gaucher disease, partner carrier screening would be recommended for their reproductive partners. If they are interested, Ms. Piacentini's siblings may have a genetic counseling consultation in the preconception or prenatal period. Carrier screening could be facilitated for them at that time.  Ms. Schoff's carrier screening was negative for the other 12 conditions screened. Thus, her risk to be a carrier for these additional conditions (listed separately in the laboratory report) has been  reduced but not eliminated. This also significantly reduces her risk of having a child affected by one of these conditions. We discussed that carrier testing for alpha-thalassemia and Gaucher disease is recommended for Ms. Maloney's partner. Ms. Vahey indicated that she may be interested in pursuing partner carrier screening.  Aneuploidy screening results:  We also reviewed that Ms. Quentin Cornwall had Panorama NIPS through the laboratory [facility] that was low-risk for fetal aneuploidies. We reviewed that these results showed a less than 1 in 10,000 risk for trisomies 21, 18 and 13, and monosomy X (Turner syndrome).  In addition, the risk for triploidy and sex chromosome trisomies (47,XXX and 47,XXY) was also low. Ms. Kopp elected to have cfDNA analysis for 22q11.2 deletion syndrome, which was also low risk (1 in 2900). We reviewed that while this testing identifies 94-99% of pregnancies with trisomy 17, trisomy 54, trisomy 81, sex chromosome aneuploidies, and triploidy, it is NOT diagnostic. A positive test result requires confirmation by CVS or amniocentesis, and a negative test result does not rule out a fetal chromosome abnormality. She also understands that this testing does not identify all genetic conditions.  Diagnostic testing:  Ms. Posas was also counseled regarding diagnostic testing via amniocentesis. We discussed the technical aspects of the procedure and quoted up to a 1 in 500 (0.2%) risk for spontaneous pregnancy loss or other adverse pregnancy outcomes as a result of amniocentesis. Cultured cells from an amniocentesis sample allow for the visualization of a fetal karyotype, which can detect >99% of chromosomal aberrations. Chromosomal microarray can also be performed to identify smaller deletions \\or  duplications of fetal chromosomal material. Amniocentesis could also be performed to assess whether  the baby is affected by alpha-thalassemia or Gaucher disease. After careful consideration,  Ms. Abrigo declined amniocentesis at this time. She understands that amniocentesis is available at any point after 16 weeks of pregnancy and that she may opt to undergo the procedure at a later date should she change her mind.  Plan:   Ms. Bourbeau indicated that she may be interested in pursuing alpha-thalassemia and Gaucher disease carrier screening for her partner Antuane. However, she wanted to discuss this option with him prior to ordering testing. Ms. Hoadley was provided with my contact information and encouraged to reach out to me if partner carrier screening is desired. If she emails me a photo of her partner's insurance card, I can perform a benefits investigation to determine the expected out of pocket cost before ordering testing.   Ms. Hinesley requested that I send her information about alpha-thalassemia and Gaucher disease. Following our session, I emailed her information about alpha-thalassemia from [facility] and Gaucher disease from [employer] (formerly [employer]).   I counseled Ms. Pierre regarding the above risks and available options. Second year [employer] genetic counseling student Nile Dear participated in portions of this session under my supervision. The approximate face-to-face time with the genetic counselor was 45 minutes.  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 Will contact me once she has discussed option with partner  Reviewed low-risk NIPS result  Reduction in risk for Down syndrome, trisomy 85, trisomy 72, triploidy, sex chromosome aneuploidies, and 22q11.2 deletion syndrome  Offered additional testing and screening  Declined amniocentesis  Reviewed family history concerns   Buelah Manis, MS, Counselling psychologist

## 2020-05-18 ENCOUNTER — Other Ambulatory Visit: Payer: Self-pay

## 2020-05-18 MED ORDER — GLUCOSE BLOOD VI STRP: ORAL_STRIP | 3 refills | Status: DC

## 2020-05-24 ENCOUNTER — Other Ambulatory Visit: Payer: Self-pay | Admitting: Advanced Practice Midwife

## 2020-05-25 ENCOUNTER — Ambulatory Visit (INDEPENDENT_AMBULATORY_CARE_PROVIDER_SITE_OTHER): Payer: 59 | Admitting: Obstetrics & Gynecology

## 2020-05-25 ENCOUNTER — Other Ambulatory Visit: Payer: Self-pay

## 2020-05-25 VITALS — BP 126/78 | HR 92 | Wt 195.0 lb

## 2020-05-25 DIAGNOSIS — Z8759 Personal history of other complications of pregnancy, childbirth and the puerperium: Secondary | ICD-10-CM

## 2020-05-25 DIAGNOSIS — Z3A20 20 weeks gestation of pregnancy: Secondary | ICD-10-CM

## 2020-05-25 DIAGNOSIS — O24912 Unspecified diabetes mellitus in pregnancy, second trimester: Secondary | ICD-10-CM

## 2020-05-25 MED ORDER — METFORMIN HCL ER 500 MG PO TB24: 1000.0000 mg | ORAL_TABLET | Freq: Every day | ORAL | 3 refills | Status: DC

## 2020-05-25 MED ORDER — METFORMIN HCL 500 MG PO TABS: 1000.0000 mg | ORAL_TABLET | Freq: Every day | ORAL | Status: DC

## 2020-05-25 NOTE — Progress Notes (Signed)
Pt states her sugars have been ok, a few days with abnormal readings. Pt was made aware of [employer] and had Diabetes feature added today. Pt made aware that she may log her glucose readings in the app once she gets it back on her phone.

## 2020-05-25 NOTE — Progress Notes (Signed)
° °  PRENATAL VISIT NOTE  Subjective:  Diane Snyder is a 39 y.o. G3P2002 at [redacted]w[redacted]d being seen today for ongoing prenatal care.  She is currently monitored for the following issues for this high-risk pregnancy and has Chest pain; Supervision of other normal pregnancy, antepartum; AMA (advanced maternal age) multigravida 74+; Maternal obesity affecting pregnancy, antepartum; History of gestational hypertension; Diabetes mellitus affecting pregnancy; and Placenta previa, posterior on their problem list.  Patient reports no complaints.  Contractions: Not present. Vag. Bleeding: None.  Movement: Present. Denies leaking of fluid.   The following portions of the patient's history were reviewed and updated as appropriate: allergies, current medications, past family history, past medical history, past social history, past surgical history and problem list.   Objective:   Vitals:   05/25/20 0930  BP: 126/78  Pulse: 92  Weight: 195 lb (88.5 kg)    Fetal Status: Fetal Heart Rate (bpm): 155   Movement: Present     General:  Alert, oriented and cooperative. Patient is in no acute distress.  Skin: Skin is warm and dry. No rash noted.   Cardiovascular: Normal heart rate noted  Respiratory: Normal respiratory effort, no problems with respiration noted  Abdomen: Soft, gravid, appropriate for gestational age.  Pain/Pressure: Present     Pelvic: Cervical exam deferred        Extremities: Normal range of motion.     Mental Status: Normal mood and affect. Normal behavior. Normal judgment and thought content.   Assessment and Plan:  Pregnancy: G3P2002 at [redacted]w[redacted]d 1. Diabetes mellitus affecting pregnancy in second trimester FBS 103-113, PP up to 135, change meds to short acting - metFORMIN (GLUCOPHAGE-XR) 500 MG 24 hr tablet; Take 2 tablets (1,000 mg total) by mouth at bedtime.  Dispense: 60 tablet; Refill: 3 - metFORMIN (GLUCOPHAGE) tablet 1,000 mg - US Fetal Echocardiography; Future - Ambulatory  referral to Ophthalmology  2. History of gestational hypertension 120's / 70's  Preterm labor symptoms and general obstetric precautions including but not limited to vaginal bleeding, contractions, leaking of fluid and fetal movement were reviewed in detail with the patient. Please refer to After Visit Summary for other counseling recommendations.   Return in about 2 weeks (around 06/08/2020) for fetal echo and ophthalmology appt.  Future Appointments  Date Time Provider [place]  06/10/2020 12:45 PM [facility] NURSE [facility]  06/10/2020 12:45 PM [facility] US5 [facility] [place]    Emeterio Reeve, MD

## 2020-05-25 NOTE — Patient Instructions (Signed)

## 2020-06-08 ENCOUNTER — Encounter: Payer: Self-pay | Admitting: Obstetrics and Gynecology

## 2020-06-08 ENCOUNTER — Other Ambulatory Visit: Payer: Self-pay

## 2020-06-08 ENCOUNTER — Ambulatory Visit (INDEPENDENT_AMBULATORY_CARE_PROVIDER_SITE_OTHER): Payer: 59 | Admitting: Obstetrics and Gynecology

## 2020-06-08 VITALS — BP 124/83 | HR 103 | Wt 194.0 lb

## 2020-06-08 DIAGNOSIS — Z3A22 22 weeks gestation of pregnancy: Secondary | ICD-10-CM

## 2020-06-08 DIAGNOSIS — O09292 Supervision of pregnancy with other poor reproductive or obstetric history, second trimester: Secondary | ICD-10-CM

## 2020-06-08 DIAGNOSIS — O09522 Supervision of elderly multigravida, second trimester: Secondary | ICD-10-CM

## 2020-06-08 DIAGNOSIS — Z8759 Personal history of other complications of pregnancy, childbirth and the puerperium: Secondary | ICD-10-CM

## 2020-06-08 DIAGNOSIS — Z3480 Encounter for supervision of other normal pregnancy, unspecified trimester: Secondary | ICD-10-CM

## 2020-06-08 DIAGNOSIS — O99212 Obesity complicating pregnancy, second trimester: Secondary | ICD-10-CM

## 2020-06-08 DIAGNOSIS — E669 Obesity, unspecified: Secondary | ICD-10-CM

## 2020-06-08 DIAGNOSIS — O24912 Unspecified diabetes mellitus in pregnancy, second trimester: Secondary | ICD-10-CM

## 2020-06-08 DIAGNOSIS — O99210 Obesity complicating pregnancy, unspecified trimester: Secondary | ICD-10-CM

## 2020-06-08 MED ORDER — METFORMIN HCL ER 500 MG PO TB24: 1000.0000 mg | ORAL_TABLET | Freq: Every day | ORAL | 3 refills | Status: DC

## 2020-06-08 NOTE — Progress Notes (Signed)
ROB, c/o tightness in chest on Monday with radiating pain down left leg on Monday. She has not been taking the Metformin. BS reading are on her phone.

## 2020-06-08 NOTE — Progress Notes (Signed)
   PRENATAL VISIT NOTE  Subjective:  Diane Snyder is a 39 y.o. G3P2002 at [redacted]w[redacted]d being seen today for ongoing prenatal care.  She is currently monitored for the following issues for this high-risk pregnancy and has Chest pain; Supervision of other normal pregnancy, antepartum; AMA (advanced maternal age) multigravida 44+; Maternal obesity affecting pregnancy, antepartum; History of gestational hypertension; Diabetes mellitus affecting pregnancy; and Placenta previa, posterior on their problem list.  Patient reports sciatic nerve pain and heart burn.  Contractions: Not present. Vag. Bleeding: None.  Movement: Present. Denies leaking of fluid.   The following portions of the patient's history were reviewed and updated as appropriate: allergies, current medications, past family history, past medical history, past social history, past surgical history and problem list.   Objective:   Vitals:   06/08/20 1006  BP: 124/83  Pulse: (!) 103  Weight: 194 lb (88 kg)    Fetal Status: Fetal Heart Rate (bpm): 158 Fundal Height: 23 cm Movement: Present     General:  Alert, oriented and cooperative. Patient is in no acute distress.  Skin: Skin is warm and dry. No rash noted.   Cardiovascular: Normal heart rate noted  Respiratory: Normal respiratory effort, no problems with respiration noted  Abdomen: Soft, gravid, appropriate for gestational age.  Pain/Pressure: Present     Pelvic: Cervical exam deferred        Extremities: Normal range of motion.  Edema: None  Mental Status: Normal mood and affect. Normal behavior. Normal judgment and thought content.   Assessment and Plan:  Pregnancy: G3P2002 at [redacted]w[redacted]d 1. Supervision of other normal pregnancy, antepartum Patient is doing well without complaints Labs next visit Follow up ultrasound for placenta location  2. Diabetes mellitus affecting pregnancy in second trimester Patient has not taken metformin in the past 2 weeks as she was not able to  obtain her prescription New Rx sent to her pharmacy CBGs reviewed and majority of fasting elevated  In 120's and a few pp as high as 150 - metFORMIN (GLUCOPHAGE-XR) 500 MG 24 hr tablet; Take 2 tablets (1,000 mg total) by mouth at bedtime.  Dispense: 60 tablet; Refill: 3  3. Multigravida of advanced maternal age in second trimester   4. Maternal obesity affecting pregnancy, antepartum Continue ASA  5. History of gestational hypertension   Preterm labor symptoms and general obstetric precautions including but not limited to vaginal bleeding, contractions, leaking of fluid and fetal movement were reviewed in detail with the patient. Please refer to After Visit Summary for other counseling recommendations.   Return in about 2 weeks (around 06/22/2020) for in person, ROB, High risk.  Future Appointments  Date Time Provider [place]  06/10/2020 12:45 PM [facility] NURSE [facility]  06/10/2020 12:45 PM [facility] US5 [facility]    Mora Bellman, MD

## 2020-06-10 ENCOUNTER — Other Ambulatory Visit: Payer: Self-pay

## 2020-06-10 ENCOUNTER — Ambulatory Visit: Payer: 59 | Attending: Obstetrics and Gynecology

## 2020-06-10 ENCOUNTER — Other Ambulatory Visit: Payer: Self-pay | Admitting: *Deleted

## 2020-06-10 ENCOUNTER — Ambulatory Visit: Payer: 59 | Admitting: *Deleted

## 2020-06-10 DIAGNOSIS — O09522 Supervision of elderly multigravida, second trimester: Secondary | ICD-10-CM

## 2020-06-10 DIAGNOSIS — Z7984 Long term (current) use of oral hypoglycemic drugs: Secondary | ICD-10-CM | POA: Diagnosis present

## 2020-06-10 DIAGNOSIS — O24415 Gestational diabetes mellitus in pregnancy, controlled by oral hypoglycemic drugs: Secondary | ICD-10-CM | POA: Diagnosis not present

## 2020-06-10 DIAGNOSIS — O99210 Obesity complicating pregnancy, unspecified trimester: Secondary | ICD-10-CM | POA: Insufficient documentation

## 2020-06-10 DIAGNOSIS — Z148 Genetic carrier of other disease: Secondary | ICD-10-CM

## 2020-06-10 DIAGNOSIS — O99212 Obesity complicating pregnancy, second trimester: Secondary | ICD-10-CM

## 2020-06-10 DIAGNOSIS — O24319 Unspecified pre-existing diabetes mellitus in pregnancy, unspecified trimester: Secondary | ICD-10-CM | POA: Diagnosis present

## 2020-06-10 DIAGNOSIS — E669 Obesity, unspecified: Secondary | ICD-10-CM

## 2020-06-10 DIAGNOSIS — O4412 Placenta previa with hemorrhage, second trimester: Secondary | ICD-10-CM

## 2020-06-10 DIAGNOSIS — Z8759 Personal history of other complications of pregnancy, childbirth and the puerperium: Secondary | ICD-10-CM | POA: Diagnosis present

## 2020-06-10 DIAGNOSIS — Z3A22 22 weeks gestation of pregnancy: Secondary | ICD-10-CM

## 2020-06-10 DIAGNOSIS — O09292 Supervision of pregnancy with other poor reproductive or obstetric history, second trimester: Secondary | ICD-10-CM

## 2020-06-10 DIAGNOSIS — Z362 Encounter for other antenatal screening follow-up: Secondary | ICD-10-CM

## 2020-06-14 ENCOUNTER — Other Ambulatory Visit: Payer: Self-pay

## 2020-06-14 DIAGNOSIS — O24912 Unspecified diabetes mellitus in pregnancy, second trimester: Secondary | ICD-10-CM

## 2020-06-14 MED ORDER — ACCU-CHEK GUIDE VI STRP: ORAL_STRIP | 12 refills | Status: DC

## 2020-06-14 NOTE — Progress Notes (Signed)
Glucose test strips sent to pt preferred pharmacy.

## 2020-06-22 ENCOUNTER — Ambulatory Visit (INDEPENDENT_AMBULATORY_CARE_PROVIDER_SITE_OTHER): Payer: 59 | Admitting: Obstetrics & Gynecology

## 2020-06-22 ENCOUNTER — Other Ambulatory Visit: Payer: Self-pay

## 2020-06-22 ENCOUNTER — Other Ambulatory Visit: Payer: Self-pay | Admitting: Obstetrics & Gynecology

## 2020-06-22 ENCOUNTER — Encounter: Payer: Self-pay | Admitting: Obstetrics & Gynecology

## 2020-06-22 VITALS — BP 125/83 | HR 96 | Wt 193.0 lb

## 2020-06-22 DIAGNOSIS — O09522 Supervision of elderly multigravida, second trimester: Secondary | ICD-10-CM

## 2020-06-22 DIAGNOSIS — Z3480 Encounter for supervision of other normal pregnancy, unspecified trimester: Secondary | ICD-10-CM

## 2020-06-22 DIAGNOSIS — O24912 Unspecified diabetes mellitus in pregnancy, second trimester: Secondary | ICD-10-CM

## 2020-06-22 DIAGNOSIS — O24415 Gestational diabetes mellitus in pregnancy, controlled by oral hypoglycemic drugs: Secondary | ICD-10-CM

## 2020-06-22 DIAGNOSIS — Z3A24 24 weeks gestation of pregnancy: Secondary | ICD-10-CM

## 2020-06-22 MED ORDER — PANTOPRAZOLE SODIUM 40 MG PO TBEC: 40.0000 mg | DELAYED_RELEASE_TABLET | Freq: Every day | ORAL | 1 refills | Status: DC

## 2020-06-22 MED ORDER — METOCLOPRAMIDE HCL 5 MG PO TABS: 5.0000 mg | ORAL_TABLET | Freq: Three times a day (TID) | ORAL | 2 refills | Status: DC

## 2020-06-22 NOTE — Patient Instructions (Signed)

## 2020-06-22 NOTE — Progress Notes (Signed)
   PRENATAL VISIT NOTE  Subjective:  Diane Snyder is a 39 y.o. G3P2002 at [redacted]w[redacted]d being seen today for ongoing prenatal care.  She is currently monitored for the following issues for this high-risk pregnancy and has Chest pain; Supervision of other normal pregnancy, antepartum; AMA (advanced maternal age) multigravida 40+; Maternal obesity affecting pregnancy, antepartum; History of gestational hypertension; Diabetes mellitus affecting pregnancy; and Placenta previa, posterior on their problem list.  Patient reports heartburn, nausea and vomiting.  Contractions: Not present. Vag. Bleeding: None.  Movement: Present. Denies leaking of fluid.   The following portions of the patient's history were reviewed and updated as appropriate: allergies, current medications, past family history, past medical history, past social history, past surgical history and problem list.   Objective:   Vitals:   06/22/20 0832  BP: 125/83  Pulse: 96  Weight: 193 lb (87.5 kg)    Fetal Status: Fetal Heart Rate (bpm): 145    Movement: Present     General:  Alert, oriented and cooperative. Patient is in no acute distress.  Skin: Skin is warm and dry. No rash noted.   Cardiovascular: Normal heart rate noted  Respiratory: Normal respiratory effort, no problems with respiration noted  Abdomen: Soft, gravid, appropriate for gestational age.  Pain/Pressure: Present     Pelvic: Cervical exam deferred        Extremities: Normal range of motion.  Edema: None  Mental Status: Normal mood and affect. Normal behavior. Normal judgment and thought content.   Assessment and Plan:  Pregnancy: G3P2002 at [redacted]w[redacted]d 1. Supervision of other normal pregnancy, antepartum Reflux, nausea for a week - pantoprazole (PROTONIX) 40 MG tablet; Take 1 tablet (40 mg total) by mouth daily.  Dispense: 30 tablet; Refill: 1 - metoCLOPramide (REGLAN) 5 MG tablet; Take 1 tablet (5 mg total) by mouth 4 (four) times daily -  before meals and at  bedtime.  Dispense: 60 tablet; Refill: 2  2. Diabetes mellitus affecting pregnancy in second trimester FBS <100, go back to 1000 mg metformin HS  3. Multigravida of advanced maternal age in second trimester   Preterm labor symptoms and general obstetric precautions including but not limited to vaginal bleeding, contractions, leaking of fluid and fetal movement were reviewed in detail with the patient. Please refer to After Visit Summary for other counseling recommendations.   Return in about 2 weeks (around 07/06/2020).  Future Appointments  Date Time Provider [place]  07/08/2020  9:30 AM [facility] NURSE [facility]  07/08/2020  9:45 AM [facility] US5 [facility]    Emeterio Reeve, MD

## 2020-06-22 NOTE — Progress Notes (Signed)
ROB   Pt states sugars have been up and down. Pt brought log of sugars to discuss with provider.

## 2020-07-06 ENCOUNTER — Telehealth (INDEPENDENT_AMBULATORY_CARE_PROVIDER_SITE_OTHER): Payer: 59 | Admitting: Obstetrics & Gynecology

## 2020-07-06 DIAGNOSIS — E669 Obesity, unspecified: Secondary | ICD-10-CM

## 2020-07-06 DIAGNOSIS — O99212 Obesity complicating pregnancy, second trimester: Secondary | ICD-10-CM

## 2020-07-06 DIAGNOSIS — Z8759 Personal history of other complications of pregnancy, childbirth and the puerperium: Secondary | ICD-10-CM

## 2020-07-06 DIAGNOSIS — Z7189 Other specified counseling: Secondary | ICD-10-CM

## 2020-07-06 DIAGNOSIS — O0992 Supervision of high risk pregnancy, unspecified, second trimester: Secondary | ICD-10-CM

## 2020-07-06 DIAGNOSIS — Z3A26 26 weeks gestation of pregnancy: Secondary | ICD-10-CM

## 2020-07-06 DIAGNOSIS — O24912 Unspecified diabetes mellitus in pregnancy, second trimester: Secondary | ICD-10-CM

## 2020-07-06 DIAGNOSIS — O99210 Obesity complicating pregnancy, unspecified trimester: Secondary | ICD-10-CM

## 2020-07-06 DIAGNOSIS — O09522 Supervision of elderly multigravida, second trimester: Secondary | ICD-10-CM

## 2020-07-06 MED ORDER — BLOOD PRESSURE KIT DEVI: 1.0000 | 0 refills | Status: DC

## 2020-07-06 NOTE — Progress Notes (Signed)
I connected with  Diane Snyder on 07/06/20 by a video enabled telemedicine application and verified that I am speaking with the correct person using two identifiers.   I discussed the limitations of evaluation and management by telemedicine. The patient expressed understanding and agreed to proceed.  MyChart OB, reports no problems today. Rx for BP Cuff sent to [facility].

## 2020-07-06 NOTE — Patient Instructions (Addendum)
Return to office for any scheduled appointments. Call the office or go to the MAU at [facility] if:  You begin to have strong, frequent contractions  Your water breaks.  Sometimes it is a big gush of fluid, sometimes it is just a trickle that keeps getting your panties wet or running down your legs  You have vaginal bleeding.  It is normal to have a small amount of spotting if your cervix was checked.   You do not feel your baby moving like normal.  If you do not, get something to eat and drink and lay down and focus on feeling your baby move.   If your baby is still not moving like normal, you should call the office or go to MAU.  Any other obstetric concerns.  TDaP Vaccine Pregnancy Get the Whooping Cough Vaccine While You Are Pregnant (CDC)  It is important for women to get the whooping cough vaccine in the third trimester of each pregnancy. Vaccines are the best way to prevent this disease. There are 2 different whooping cough vaccines. Both vaccines combine protection against whooping cough, tetanus and diphtheria, but they are for different age groups: Tdap: for everyone 11 years or older, including pregnant women  DTaP: for children 2 months through 6 years of age  You need the whooping cough vaccine during each of your pregnancies The recommended time to get the shot is during your 27th through 36th week of pregnancy, preferably during the earlier part of this time period. The Centers for Disease Control and Prevention (CDC) recommends that pregnant women receive the whooping cough vaccine for adolescents and adults (called Tdap vaccine) during the third trimester of each pregnancy. The recommended time to get the shot is during your 27th through 36th week of pregnancy, preferably during the earlier part of this time period. This replaces the original recommendation that pregnant women get the vaccine only if they had not previously received it. The  American College of Obstetricians and Gynecologists and the American College of Nurse-Midwives support this recommendation.  You should get the whooping cough vaccine while pregnant to pass protection to your baby frame support disabled and/or not supported in this browser  Learn why Laura decided to get the whooping cough vaccine in her 3rd trimester of pregnancy and how her baby girl was born with some protection against the disease. Also available on YouTube. After receiving the whooping cough vaccine, your body will create protective antibodies (proteins produced by the body to fight off diseases) and pass some of them to your baby before birth. These antibodies provide your baby some short-term protection against whooping cough in early life. These antibodies can also protect your baby from some of the more serious complications that come along with whooping cough. Your protective antibodies are at their highest about 2 weeks after getting the vaccine, but it takes time to pass them to your baby. So the preferred time to get the whooping cough vaccine is early in your third trimester. The amount of whooping cough antibodies in your body decreases over time. That is why CDC recommends you get a whooping cough vaccine during each pregnancy. Doing so allows each of your babies to get the greatest number of protective antibodies from you. This means each of your babies will get the best protection possible against this disease.  Getting the whooping cough vaccine while pregnant is better than getting the vaccine after you give birth Whooping cough vaccination during   pregnancy is ideal so your baby will have short-term protection as soon as he is born. This early protection is important because your baby will not start getting his whooping cough vaccines until he is 2 months old. These first few months of life are when your baby is at greatest risk for catching whooping cough. This is also when he's at  greatest risk for having severe, potentially life-threating complications from the infection. To avoid that gap in protection, it is best to get a whooping cough vaccine during pregnancy. You will then pass protection to your baby before he is born. To continue protecting your baby, he should get whooping cough vaccines starting at 2 months old. You may never have gotten the Tdap vaccine before and did not get it during this pregnancy. If so, you should make sure to get the vaccine immediately after you give birth, before leaving the hospital or birthing center. It will take about 2 weeks before your body develops protection (antibodies) in response to the vaccine. Once you have protection from the vaccine, you are less likely to give whooping cough to your newborn while caring for him. But remember, your baby will still be at risk for catching whooping cough from others. A recent study looked to see how effective Tdap was at preventing whooping cough in babies whose mothers got the vaccine while pregnant or in the hospital after giving birth. The study found that getting Tdap between 27 through 36 weeks of pregnancy is 85% more effective at preventing whooping cough in babies younger than 2 months old. Blood tests cannot tell if you need a whooping cough vaccine There are no blood tests that can tell you if you have enough antibodies in your body to protect yourself or your baby against whooping cough. Even if you have been sick with whooping cough in the past or previously received the vaccine, you still should get the vaccine during each pregnancy. Breastfeeding may pass some protective antibodies onto your baby By breastfeeding, you may pass some antibodies you have made in response to the vaccine to your baby. When you get a whooping cough vaccine during your pregnancy, you will have antibodies in your breast milk that you can share with your baby as soon as your milk comes in. However, your baby will not  get protective antibodies immediately if you wait to get the whooping cough vaccine until after delivering your baby. This is because it takes about 2 weeks for your body to create antibodies. Learn more about the health benefits of breastfeeding.     Coronavirus (COVID-19) and Pregnancy:  Frequently Asked Questions   How might coronavirus affect my pregnancy? The data for COVID-19 is limited, but we know that women with other coronavirus infections (such as SARS-CoV) did NOT have miscarriage or stillbirth at higher rates than the general population.  On the other hand, we know that having other respiratory viral infections during pregnancy, such as flu, has been associated with problems like low birth weight and preterm birth. Also, having a high fever early in pregnancy may increase the risk of certain birth defects.  Could I transmit coronavirus to my baby during pregnancy or delivery? Among the few case studies of infants born to mothers with COVID-19 published in peer-reviewed literature, none of the infants tested positive for the virus. And there have been no reports of mother-to-baby transmission for other coronaviruses (MERS-CoV and SARS-CoV). Also, there was no virus detected in samples of amniotic fluid or breast  milk. But there have been a few reports of newborns as young as a few days old with infection, suggesting that a mother can transmit the infection to her infant through close contact after delivery.  Is it safe for me to deliver at a hospital where there have been COVID-19 cases? It should be. We know that COVID-19 is a very scary virus. The good news is that hospitals are taking great precautions to keep patients and healthcare providers safe.  According to the CDC guidelines, when a patient is even suspected to have COVID-19, they should be placed in a negative pressure room. (Think of these rooms as vacuums that suck and filter the air so it's safe for the other people in the  hospital.) If there are no rooms available, these patients should be asked to wait at home until they can be accomodated safely. This should make it possible for you to deliver at the hospital without putting you or your baby at risk.  Hospitals are also implementing stricter visiting policies to keep patients safe. It's worth calling your hospital to check if there are any new regulations to be aware of.  What plans should I make now in case the hospital system is overwhelmed when it's time for me to deliver? Every hospital is making different plans for dealing with this scenario. Talk with your doctor or midwife once you're at least [redacted] weeks pregnant. I work in [employer].   I work in [employer]. Should I ask my doctor to excuse me from work until the baby is born? Should I ask my doctor to excuse me from work until the baby is born? What if I work in a school, the travel [place], or some other high-risk setting? Healthcare facilities should take care to limit the exposure of pregnant employees to patients with confirmed or suspected COVID-19, just as they would with other infectious cases. If you continue working, be sure to follow the CDC's risk assessment and infection control guidelines.  If you work in a school, travel [place], or other high-risk setting, talk with your employer about what it's doing to protect employees and minimize infection risks. Wash your hands often.   What if my OB gets COVID-19? If your doctor or midwife tests positive for COVID-19, they will need to be quarantined until they recover and are no longer at risk of transmitting the virus. In this case, you'll be assigned to another OB in your doctor's practice (or you may choose another practitioner yourself). Ask your new OB or your doctor's office if you should self-quarantine or be tested for the virus. (It will depend on when you last saw your provider and when that person tested positive.)  Should we hold off on  trying to conceive because of COVID-19? At this time, there's no reason to hold off on trying to get pregnant, but the data we have is really limited. For example, we don't think the virus causes birth defects or increases your risk of miscarriage. But we don't know for sure whether you could transmit COVID-19 to your baby before or during delivery. We also don't know if the virus lives in semen or can be sexually transmitted.  We have a babymoon scheduled in the next few months - should we cancel? Yes. At this time, the virus has reached more than 140 countries, and there are travel bans to [place], most of [place], and [place]. Places where large numbers of people gather are at highest risk, especially airports and  cruise ships.  If you were planning travel in the [place] note that any travel setting increases your risk of exposure, and there are already many places where everyone is being asked to stay home. To see how the virus is spreading, check The [place] Times map based on CDC data.  For the most current advice to help you avoid exposure, check the CDC's COVID-19 travel page.  Will the hospital separate me from my newborn and keep the baby in quarantine? If you don't have COVID-19 and have not been exposed to the virus, the hospital will not separate you from your newborn. If you do test positive for COVID-19 or have been exposed but have no symptoms, the CDC, [employer] of Obstetricians and Gynecologists, and the Society for [place] all recommend that you be separated from your baby to decrease the risk of transmission to the baby. This would last until you are no longer at risk of transmitting the virus.  This scenario would, of course, be beyond heartbreaking. Talk to the hospital, your baby's pediatrician, and your family about how to plan for care of your baby in the event that you have to be separated after delivery. And try to make sure you have the emotional support you  would need to endure the sadness and stress of having to potentially wait weeks to meet your newborn.   My hospital is restricting visitors and only allowing one support person. If my support person leaves after the delivery, will they be allowed to come back? Every hospital has different policies. Contact your hospital or labor and delivery unit a week or so before delivery to get the most up-to-date restrictions. In general, if your support person needs to leave, they would be allowed back unless they knew they were exposed to COVID-19 after leaving your company.  My mom was planning to fly here to help me care for my new baby after delivery. Should I tell her not to come? Yes. If your mom is over 43 or has any serious chronic medical conditions (such as heart disease, lung disease, or diabetes), she is at higher risk of serious illness from COVID-19 and should avoid air travel. And remember that any travel setting increases a person's risk of exposure. So, it may be risky to have her around the baby after she has been traveling. For the most current advice on traveling, check the CDC's COVID-19 travel

## 2020-07-06 NOTE — Progress Notes (Signed)
OBSTETRICS PRENATAL VIRTUAL VISIT ENCOUNTER NOTE  Provider location: [facility] [place]   I connected with Diane Snyder on 07/06/20 at  9:30 AM EDT by MyChart Video Encounter at home and verified that I am speaking with the correct person using two identifiers.   I discussed the limitations, risks, security and privacy concerns of performing an evaluation and management service virtually and the availability of in person appointments. I also discussed with the patient that there may be a patient responsible charge related to this service. The patient expressed understanding and agreed to proceed. Subjective:  Diane Snyder is a 39 y.o. G3P2002 at 62w4dbeing seen today for ongoing prenatal care.  She is currently monitored for the following issues for this Snyder-risk pregnancy and has Chest pain; Supervision of Snyder-risk pregnancy; AMA (advanced maternal age) multigravida 336+ Maternal obesity affecting pregnancy, antepartum; History of gestational hypertension; and Diabetes mellitus affecting pregnancy on their problem list.  Patient reports no complaints.  Contractions: Not present. Vag. Bleeding: None.  Movement: Present. Denies any leaking of fluid.   The following portions of the patient's history were reviewed and updated as appropriate: allergies, current medications, past family history, past medical history, past social history, past surgical history and problem list.   Objective:  There were no vitals filed for this visit.  Fetal Status:     Movement: Present     General:  Alert, oriented and cooperative. Patient is in no acute distress.  Respiratory: Normal respiratory effort, no problems with respiration noted  Mental Status: Normal mood and affect. Normal behavior. Normal judgment and thought content.  Rest of physical exam deferred due to type of encounter  Imaging: UK[place]MFM OB FOLLOW UP  Result Date:  06/10/2020 ----------------------------------------------------------------------  OBSTETRICS REPORT                       (Signed Final 06/10/2020 06:04 pm) ---------------------------------------------------------------------- Patient Info  ID #:       [id number]                         D.O.B.:  [birth year](39 yrs)  Name:       Diane Snyder             Visit Date: 06/10/2020 01:04 pm ---------------------------------------------------------------------- Performed By  Attending:        CSander Snyder     Ref. Address:     Faculty                    MD  Performed By:     HNovella Snyder       [facility]  Referred By:      Diane Snyder                    CONSTANT MD ---------------------------------------------------------------------- Orders  #  Description                           Code        Ordered By  1  [place] MFM OB FOLLOW UP                   26203.55    Diane Snyder ----------------------------------------------------------------------  #  Order #                     Accession #                Episode #  1  [id number]                   [id number]                 [id number] ---------------------------------------------------------------------- Indications  Gestational diabetes in pregnancy,             O24.415  controlled by oral hypoglycemic drugs  Advanced maternal age multigravida 64+,        O4.522  second trimester  Obesity complicating pregnancy, second         O99.212  trimester (BMI 105)  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 Cord Insertion:      Previously Visualized  Amniotic Fluid  AFI FV:      Within normal limits                              Largest Pocket(cm)                              5.  Comment:    1 ---------------------------------------------------------------------- Biometry  FL/HC:      20.4   %    19.2 - 20.8  HC:      201.8  mm     G. Age:  22w 2d         17  %    HC/AC:      1.07        1.05 - 1.21  AC:      187.9  mm     G. Age:  23w 4d         65  %  FL:       41.1  mm     G. Age:  23w 2d         55  %    FL/AC:      21.9   %    20 - 24  HUM:      37.7  mm     G. Age:  23w 2d         51  %  Est. FW:     579  gm      1 lb 4 oz     64  % ---------------------------------------------------------------------- OB History  Gravidity:    3         Term:   2  Living:       2 ---------------------------------------------------------------------- Gestational Age  U/S Today:     23w 0d                                        EDD:   10/07/20  Best:          22w 6d     Det. By:  Previous Ultrasound      EDD:   10/08/20                                      (03/16/20) ---------------------------------------------------------------------- Anatomy  Cranium:               Appears normal         Aortic Arch:            Appears normal  Cavum:                 Appears normal         Ductal Arch:            Appears normal  Ventricles:            Previously seen        Diaphragm:              Appears normal  Choroid Plexus:        Previously seen        Stomach:                Appears normal, left                                                                        sided  Cerebellum:  Previously seen         Abdomen:                Appears normal  Posterior Fossa:       Previously seen        Abdominal Wall:         Appears nml (cord                                                                        insert, abd wall)  Nuchal Fold:           Previously seen        Cord Vessels:           Appears normal (3                                                                        vessel cord)  Face:                  Orbits and profile     Kidneys:                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 Technically difficult due to fetal position. ---------------------------------------------------------------------- Cervix Uterus Adnexa  Cervix  Length:           3.71  cm.  Normal appearance by transabdominal scan. ---------------------------------------------------------------------- Impression  Single intrauterine pregnancy here for a follwo up ultrasound  due to type 2 diabetes  Normal anatomy with measurements consistent with dates  There is good fetal movement and amniotic fluid volume  Suboptimal views of the fetal anatomy were obtained  secondary to fetal position.  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 There is no evidence of hydronephrosis or  obstruction and no  ureterocele.  I discussed that this may be a normal kidney given the  absence of additional findings. However, if present there is an  increased risk for additional abnormalities and postnatal  follow up is recommended.  I reviewed the images and she had an understanding of  today's examination. ---------------------------------------------------------------------- Recommendations  Follow up growth scheduled in 6 weeks. ----------------------------------------------------------------------               Diane Nephew, MD Electronically Signed Final Report   06/10/2020 06:04 pm ----------------------------------------------------------------------   Assessment and Plan:  Pregnancy: O3Z8588 at 82w4d1. Diabetes mellitus affecting pregnancy in second trimester BS reviewed with patient. Elevated fastings in 100-110s, PP within range. Taking Metformin 500 mg qhs, told her to increase to 1000 mg po qhs as recommended at her previous visit last month. Will continue to evaluate.  Growth scan scheduled on 07/08/20.  2. History of gestational hypertension Sent BP, cuff. Will have office visit soon to follow up BP.  - Blood Pressure Monitoring (BLOOD PRESSURE KIT) DEVI; 1 kit by Does not apply route once a week. Check Blood Pressure regularly and record readings into the Bab[employer]s App.  Large Cuff.  DX O90.0  Dispense: 1 each; Refill: 0  3. Maternal obesity affecting pregnancy, antepartum TWG -16 lb, but no current weight. Will chek next visit.  4. [redacted] weeks gestation of pregnancy 5. Multigravida of advanced maternal age in second trimester 6. Supervision of Snyder risk pregnancy in second trimester No other concerns. The patient was counseled on the potential benefits and lack of known risks of COVID vaccination, during pregnancy and breastfeeding, on today's visit. The patient's questions and concerns were addressed today. The patient is still unsure of her decision for vaccination. Preterm labor  symptoms and general obstetric precautions including but not limited to vaginal bleeding, contractions, leaking of fluid and fetal movement were reviewed in detail with the patient. I discussed the assessment and treatment plan with the patient. The patient was provided an opportunity to ask questions and all were answered. The patient agreed with the plan and demonstrated an understanding of the instructions. The patient was advised to call back or seek an in-person office evaluation/[facility] or concerning [facility]. Please refer to After Visit Summary for other counseling recommendations.   I provided 15 minutes of face-to-face time during this encounter.  Return in about 2 weeks (around 07/20/2020) for OFFICE OB Visit, 3rd trimester labs (no GTT, already has DM), TDap.  Future Appointments  Date Time  Provider [place]  07/06/2020  9:30 AM Tasman Zapata, Sallyanne Havers, MD [place] None  07/08/2020  9:30 AM [facility] NURSE [facility]  07/08/2020  9:45 AM [facility] US5 [facility] [place]    Verita Schneiders, MD [facility] [place]

## 2020-07-08 ENCOUNTER — Other Ambulatory Visit: Payer: Self-pay | Admitting: *Deleted

## 2020-07-08 ENCOUNTER — Other Ambulatory Visit: Payer: Self-pay

## 2020-07-08 ENCOUNTER — Ambulatory Visit: Payer: 59 | Attending: Maternal & Fetal Medicine

## 2020-07-08 ENCOUNTER — Ambulatory Visit: Payer: 59 | Admitting: *Deleted

## 2020-07-08 ENCOUNTER — Encounter: Payer: Self-pay | Admitting: *Deleted

## 2020-07-08 DIAGNOSIS — O99210 Obesity complicating pregnancy, unspecified trimester: Secondary | ICD-10-CM | POA: Diagnosis present

## 2020-07-08 DIAGNOSIS — E669 Obesity, unspecified: Secondary | ICD-10-CM

## 2020-07-08 DIAGNOSIS — Z362 Encounter for other antenatal screening follow-up: Secondary | ICD-10-CM

## 2020-07-08 DIAGNOSIS — Z8759 Personal history of other complications of pregnancy, childbirth and the puerperium: Secondary | ICD-10-CM | POA: Diagnosis present

## 2020-07-08 DIAGNOSIS — O24415 Gestational diabetes mellitus in pregnancy, controlled by oral hypoglycemic drugs: Secondary | ICD-10-CM | POA: Diagnosis not present

## 2020-07-08 DIAGNOSIS — Z148 Genetic carrier of other disease: Secondary | ICD-10-CM

## 2020-07-08 DIAGNOSIS — O09522 Supervision of elderly multigravida, second trimester: Secondary | ICD-10-CM | POA: Diagnosis not present

## 2020-07-08 DIAGNOSIS — O99212 Obesity complicating pregnancy, second trimester: Secondary | ICD-10-CM

## 2020-07-08 DIAGNOSIS — O09292 Supervision of pregnancy with other poor reproductive or obstetric history, second trimester: Secondary | ICD-10-CM

## 2020-07-08 DIAGNOSIS — Z3A26 26 weeks gestation of pregnancy: Secondary | ICD-10-CM

## 2020-07-08 DIAGNOSIS — O24410 Gestational diabetes mellitus in pregnancy, diet controlled: Secondary | ICD-10-CM

## 2020-07-15 ENCOUNTER — Encounter: Payer: Self-pay | Admitting: Family Medicine

## 2020-07-17 ENCOUNTER — Other Ambulatory Visit: Payer: Self-pay | Admitting: Advanced Practice Midwife

## 2020-07-19 ENCOUNTER — Other Ambulatory Visit: Payer: Self-pay

## 2020-07-19 ENCOUNTER — Telehealth: Payer: Self-pay

## 2020-07-19 ENCOUNTER — Inpatient Hospital Stay (HOSPITAL_COMMUNITY)
Admission: AD | Admit: 2020-07-19 | Discharge: 2020-07-19 | Disposition: A | Discharge status: Home / Self Care | Payer: 59 | Attending: Obstetrics and Gynecology | Admitting: Obstetrics and Gynecology

## 2020-07-19 ENCOUNTER — Encounter (HOSPITAL_COMMUNITY): Payer: Self-pay | Admitting: Obstetrics and Gynecology

## 2020-07-19 DIAGNOSIS — Z7984 Long term (current) use of oral hypoglycemic drugs: Secondary | ICD-10-CM | POA: Insufficient documentation

## 2020-07-19 DIAGNOSIS — O09523 Supervision of elderly multigravida, third trimester: Secondary | ICD-10-CM | POA: Diagnosis not present

## 2020-07-19 DIAGNOSIS — O24113 Pre-existing diabetes mellitus, type 2, in pregnancy, third trimester: Secondary | ICD-10-CM | POA: Insufficient documentation

## 2020-07-19 DIAGNOSIS — Z833 Family history of diabetes mellitus: Secondary | ICD-10-CM | POA: Insufficient documentation

## 2020-07-19 DIAGNOSIS — O26893 Other specified pregnancy related conditions, third trimester: Secondary | ICD-10-CM

## 2020-07-19 DIAGNOSIS — R109 Unspecified abdominal pain: Secondary | ICD-10-CM

## 2020-07-19 DIAGNOSIS — Z3A28 28 weeks gestation of pregnancy: Secondary | ICD-10-CM | POA: Insufficient documentation

## 2020-07-19 DIAGNOSIS — R1031 Right lower quadrant pain: Secondary | ICD-10-CM | POA: Insufficient documentation

## 2020-07-19 DIAGNOSIS — Z7982 Long term (current) use of aspirin: Secondary | ICD-10-CM | POA: Insufficient documentation

## 2020-07-19 DIAGNOSIS — R1032 Left lower quadrant pain: Secondary | ICD-10-CM | POA: Insufficient documentation

## 2020-07-19 DIAGNOSIS — Z79899 Other long term (current) drug therapy: Secondary | ICD-10-CM | POA: Diagnosis not present

## 2020-07-19 DIAGNOSIS — E119 Type 2 diabetes mellitus without complications: Secondary | ICD-10-CM | POA: Diagnosis not present

## 2020-07-19 DIAGNOSIS — Z9049 Acquired absence of other specified parts of digestive tract: Secondary | ICD-10-CM | POA: Diagnosis not present

## 2020-07-19 DIAGNOSIS — Z87891 Personal history of nicotine dependence: Secondary | ICD-10-CM | POA: Diagnosis not present

## 2020-07-19 LAB — CBC WITH DIFFERENTIAL/PLATELET
Abs Immature Granulocytes: 0.04 10*3/uL (ref 0.00–0.07)
Basophils Absolute: 0 10*3/uL (ref 0.0–0.1)
Basophils Relative: 0 %
Eosinophils Absolute: 0.1 10*3/uL (ref 0.0–0.5)
Eosinophils Relative: 1 %
HCT: 34.2 % — ABNORMAL LOW (ref 36.0–46.0)
Hemoglobin: 10.9 g/dL — ABNORMAL LOW (ref 12.0–15.0)
Immature Granulocytes: 1 %
Lymphocytes Relative: 14 %
Lymphs Abs: 1.1 10*3/uL (ref 0.7–4.0)
MCH: 26.7 pg (ref 26.0–34.0)
MCHC: 31.9 g/dL (ref 30.0–36.0)
MCV: 83.6 fL (ref 80.0–100.0)
Monocytes Absolute: 0.5 10*3/uL (ref 0.1–1.0)
Monocytes Relative: 6 %
Neutro Abs: 6.4 10*3/uL (ref 1.7–7.7)
Neutrophils Relative %: 78 %
Platelets: 222 10*3/uL (ref 150–400)
RBC: 4.09 MIL/uL (ref 3.87–5.11)
RDW: 14.5 % (ref 11.5–15.5)
WBC: 8.2 10*3/uL (ref 4.0–10.5)
nRBC: 0 % (ref 0.0–0.2)

## 2020-07-19 LAB — URINALYSIS, ROUTINE W REFLEX MICROSCOPIC
Bilirubin Urine: NEGATIVE
Glucose, UA: NEGATIVE mg/dL
Hgb urine dipstick: NEGATIVE
Ketones, ur: 20 mg/dL — AB
Nitrite: NEGATIVE
Protein, ur: NEGATIVE mg/dL
Specific Gravity, Urine: 1.014 (ref 1.005–1.030)
pH: 7 (ref 5.0–8.0)

## 2020-07-19 NOTE — Telephone Encounter (Signed)
TC from pt states she spoke with a nurse yesterday and last night and was given recommendations to relieve RLQ abdominal pain but nothing will subside the pain 8/10.  +FM, denies n/v, denies LOB/VB Spoke with appt desk; no available appts today Pt to report to MAU

## 2020-07-19 NOTE — MAU Provider Note (Signed)
Chief Complaint:  Abdominal Pain   First Provider Initiated Contact with Patient 07/19/20 1026     HPI: Diane Snyder is a 39 y.o. G3P2002 at 25w3dwho presents to maternity admissions reporting abdominal pain. Symptoms started yesterday. Reports constant pain in RLQ & intermittent pain in LLQ. Pain is worse with movement & lying on her sides. Denies fever/chills, n/v/d, dysuria, vaginal bleeding, vaginal discharge, or LOF. Good fetal movement. Has not treated pain.   Location: abdomen Quality: sharp, pressure Severity: 7/10 in pain scale Duration: 2 days Timing: intermittent & constant Modifying factors: worse with movement & side lying position Associated signs and symptoms: none  Pregnancy Course: [place], T2DM  Past Medical History:  Diagnosis Date  . Diverticulosis   . GDM (gestational diabetes mellitus) 03/2020   OB History  Gravida Para Term Preterm AB Living  '3 2 2     2  ' SAB TAB Ectopic Multiple Live Births          2    # Outcome Date GA Lbr Len/2nd Weight Sex Delivery Anes PTL Lv  3 Current           2 Term 02/16/08 [id number] g F Vag-Spont None  LIV  1 Term 10/11/06 [id number] g M Vag-Spont EPI  LIV   Past Surgical History:  Procedure Laterality Date  . CHOLECYSTECTOMY     Family History  Problem Relation Age of Onset  . Hypertension Mother   . Heart disease Maternal Grandmother   . Hypertension Maternal Grandmother   . Diabetes Maternal Grandmother   . Stroke Maternal Grandfather   . Heart disease Maternal Grandfather   . Heart attack Maternal Grandfather    Social History   Tobacco Use  . Smoking status: Former [employer] (life sciences) Smokeless tobacco: Never Used  Vaping Use  . Vaping Use: Never used  Substance Use Topics  . Alcohol use: Not Currently  . Drug use: No   Allergies  Allergen Reactions  . Contrast Media [Iodinated Diagnostic Agents]    No medications prior to admission.    I have reviewed patient's Past Medical Hx, Surgical Hx,  Family Hx, Social Hx, medications and allergies.   ROS:  Review of Systems  Constitutional: Negative.   Gastrointestinal: Positive for abdominal pain. Negative for constipation, diarrhea, nausea and vomiting.  Genitourinary: Negative.     Physical Exam   Patient Vitals for the past 24 hrs:  BP Temp Temp src Pulse Resp SpO2 Height Weight  07/19/20 1215 124/77 -- -- 96 -- -- -- --  07/19/20 0943 129/72 98.3 F (36.8 C) Oral (!) 101 16 98 % -- --  07/19/20 0939 -- -- -- -- -- -- 5' 6.5" (1.689 m) 86.1 kg    Constitutional: Well-developed, well-nourished female in no acute distress.  Cardiovascular: normal rate & rhythm, no murmur Respiratory: normal effort, lung sounds clear throughout GI: Abd soft, non-tender, gravid appropriate for gestational age. Pos BS x 4 MS: Extremities nontender, no edema, normal ROM Neurologic: Alert and oriented x 4.  GU:   Dilation: Closed Effacement (%): Thick Cervical Position: Posterior Exam by:: EriJorje GuildP  Fetal Tracing:  Baseline: 145 Variability: moderate Accelerations: 10x10 Decelerations: none  Toco: none    Labs: Results for orders placed or performed during the hospital encounter of 07/19/20 (from the past 24 hour(s))  Urinalysis, Routine w reflex microscopic Urine, Clean Catch     Status: Abnormal   Collection Time: 07/19/20  9:41 AM  Result Value Ref Range   Color, Urine YELLOW YELLOW   APPearance HAZY (A) CLEAR   Specific Gravity, Urine 1.014 1.005 - 1.030   pH 7.0 5.0 - 8.0   Glucose, UA NEGATIVE NEGATIVE mg/dL   Hgb urine dipstick NEGATIVE NEGATIVE   Bilirubin Urine NEGATIVE NEGATIVE   Ketones, ur 20 (A) NEGATIVE mg/dL   Protein, ur NEGATIVE NEGATIVE mg/dL   Nitrite NEGATIVE NEGATIVE   Leukocytes,Ua TRACE (A) NEGATIVE   RBC / HPF 0-5 0 - 5 RBC/hpf   WBC, UA 0-5 0 - 5 WBC/hpf   Bacteria, UA RARE (A) NONE SEEN   Squamous Epithelial / LPF 0-5 0 - 5   Mucus PRESENT   CBC with Differential/Platelet     Status:  Abnormal   Collection Time: 07/19/20 11:12 AM  Result Value Ref Range   WBC 8.2 4.0 - 10.5 K/uL   RBC 4.09 3.87 - 5.11 MIL/uL   Hemoglobin 10.9 (L) 12.0 - 15.0 g/dL   HCT 34.2 (L) 36 - 46 %   MCV 83.6 80.0 - 100.0 fL   MCH 26.7 26.0 - 34.0 pg   MCHC 31.9 30.0 - 36.0 g/dL   RDW 14.5 11.5 - 15.5 %   Platelets 222 150 - 400 K/uL   nRBC 0.0 0.0 - 0.2 %   Neutrophils Relative % 78 %   Neutro Abs 6.4 1.7 - 7.7 K/uL   Lymphocytes Relative 14 %   Lymphs Abs 1.1 0.7 - 4.0 K/uL   Monocytes Relative 6 %   Monocytes Absolute 0.5 0 - 1 K/uL   Eosinophils Relative 1 %   Eosinophils Absolute 0.1 0 - 0 K/uL   Basophils Relative 0 %   Basophils Absolute 0.0 0 - 0 K/uL   Immature Granulocytes 1 %   Abs Immature Granulocytes 0.04 0.00 - 0.07 K/uL    Imaging:  No results found.  MAU Course: Orders Placed This Encounter  Procedures  . Urinalysis, Routine w reflex microscopic Urine, Clean Catch  . CBC with Differential/Platelet  . Discharge patient   No orders of the defined types were placed in this encounter.   MDM: Fetal tracing appropriate for gestational age. No contractions. Cervix closed/thick.  Benign abdominal exam.  Patient is afebrile & without leukocytosis. Pain likely due to fetal positioning or round ligament. Recommend tylenol & maternity support belt. Instructed to return to care for worsening symptoms, contractions, or fever.   Assessment: 1. Abdominal pain during pregnancy in third trimester   2. [redacted] weeks gestation of pregnancy     Plan: Discharge home in stable condition.  Reviewed reasons to return to MAU    Allergies as of 07/19/2020      Reactions   Contrast Media [iodinated Diagnostic Agents]       Medication List    TAKE these medications   Accu-Chek Guide test strip Generic drug: glucose blood Use to check blood sugars four times a day was instructed   Accu-Chek Guide w/Device Kit 1 Device by Does not apply route in the morning, at noon, in the  evening, and at bedtime.   Accu-Chek Softclix Lancets lancets Use as instructed   aspirin EC 81 MG tablet Take 1 tablet (81 mg total) by mouth daily. Take after 12 weeks for prevention of preeclampsia later in pregnancy   Blood Pressure Kit Devi 1 kit by Does not apply route once a week. Check Blood Pressure regularly and record readings into the [employer] App.  Large Cuff.  DX  O90.0   metFORMIN 500 MG tablet Commonly known as: GLUCOPHAGE Take by mouth 2 (two) times daily with a meal.   metoCLOPramide 5 MG tablet Commonly known as: Reglan Take 1 tablet (5 mg total) by mouth 4 (four) times daily -  before meals and at bedtime.   pantoprazole 40 MG tablet Commonly known as: Protonix Take 1 tablet (40 mg total) by mouth daily.   PRENATAL GUMMIES/DHA & FA PO Take by mouth.   TYLENOL PO Take by mouth as needed.       Jorje Guild, NP 07/19/2020 5:24 PM

## 2020-07-19 NOTE — Discharge Instructions (Signed)
Return to care   If you have heavier bleeding that soaks through more that 2 pads per hour for an hour or more  If you bleed so much that you feel like you might pass out or you do pass out  If you have significant abdominal pain that is not improved with Tylenol      Abdominal Pain During Pregnancy  Abdominal pain is common during pregnancy, and has many possible causes. Some causes are more serious than others, and sometimes the cause is not known. Abdominal pain can be a sign that labor is starting. It can also be caused by normal growth and stretching of muscles and ligaments during pregnancy. Always tell your health care provider if you have any abdominal pain. Follow these instructions at home:  Do not have sex or put anything in your vagina until your pain goes away completely.  Get plenty of rest until your pain improves.  Drink enough fluid to keep your urine pale yellow.  Take over-the-counter and prescription medicines only as told by your health care provider.  Keep all follow-up visits as told by your health care provider. This is important. Contact a health care provider if:  Your pain continues or gets worse after resting.  You have lower abdominal pain that: ? Comes and goes at regular intervals. ? Spreads to your back. ? Is similar to menstrual cramps.  You have pain or burning when you urinate. Get help right away if:  You have a fever or chills.  You have vaginal bleeding.  You are leaking fluid from your vagina.  You are passing tissue from your vagina.  You have vomiting or diarrhea that lasts for more than 24 hours.  Your baby is moving less than usual.  You feel very weak or faint.  You have shortness of breath.  You develop severe pain in your upper abdomen. Summary  Abdominal pain is common during pregnancy, and has many possible causes.  If you experience abdominal pain during pregnancy, tell your health care provider right  away.  Follow your health care provider's home care instructions and keep all follow-up visits as directed. This information is not intended to replace advice given to you by your health care provider. Make sure you discuss any questions you have with your health care provider. Document Revised: 03/02/2019 Document Reviewed: 02/14/2017 Elsevier Patient Education  [place].

## 2020-07-19 NOTE — MAU Note (Signed)
Diane Snyder is a 39 y.o. at [redacted]w[redacted]d here in MAU reporting: since yesterday has been having sharp right sided pain. No bleeding or LOF. +FM  Onset of complaint: yesterday  Pain score: 7/10  Vitals:   07/19/20 0943  BP: 129/72  Pulse: (!) 101  Resp: 16  Temp: 98.3 F (36.8 C)  SpO2: 98%     FHT: +FM  Lab orders placed from triage: UA

## 2020-07-20 ENCOUNTER — Encounter: Payer: Self-pay | Admitting: Obstetrics and Gynecology

## 2020-07-20 ENCOUNTER — Ambulatory Visit (INDEPENDENT_AMBULATORY_CARE_PROVIDER_SITE_OTHER): Payer: 59 | Admitting: Obstetrics and Gynecology

## 2020-07-20 ENCOUNTER — Other Ambulatory Visit: Payer: Self-pay

## 2020-07-20 VITALS — BP 126/83 | HR 98 | Wt 189.9 lb

## 2020-07-20 DIAGNOSIS — O09523 Supervision of elderly multigravida, third trimester: Secondary | ICD-10-CM

## 2020-07-20 DIAGNOSIS — O0993 Supervision of high risk pregnancy, unspecified, third trimester: Secondary | ICD-10-CM

## 2020-07-20 DIAGNOSIS — Z8759 Personal history of other complications of pregnancy, childbirth and the puerperium: Secondary | ICD-10-CM

## 2020-07-20 DIAGNOSIS — O24913 Unspecified diabetes mellitus in pregnancy, third trimester: Secondary | ICD-10-CM

## 2020-07-20 DIAGNOSIS — O99210 Obesity complicating pregnancy, unspecified trimester: Secondary | ICD-10-CM

## 2020-07-20 DIAGNOSIS — Z23 Encounter for immunization: Secondary | ICD-10-CM

## 2020-07-20 MED ORDER — INSULIN STARTER KIT- SYRINGES (ENGLISH): 1.0000 | Freq: Once | Status: DC

## 2020-07-20 MED ORDER — INSULIN NPH (HUMAN) (ISOPHANE) 100 UNIT/ML ~~LOC~~ SUSP: 15.0000 [IU] | Freq: Every day | SUBCUTANEOUS | 11 refills | Status: DC

## 2020-07-20 NOTE — Progress Notes (Signed)
   PRENATAL VISIT NOTE  Subjective:  Diane Snyder is a 39 y.o. G3P2002 at [redacted]w[redacted]d being seen today for ongoing prenatal care.  She is currently monitored for the following issues for this high-risk pregnancy and has Chest pain; Supervision of high-risk pregnancy; AMA (advanced maternal age) multigravida 42+; Maternal obesity affecting pregnancy, antepartum; History of gestational hypertension; and Diabetes mellitus affecting pregnancy on their problem list.  Patient reports no complaints.  Contractions: Irritability. Vag. Bleeding: None.  Movement: Present. Denies leaking of fluid.   The following portions of the patient's history were reviewed and updated as appropriate: allergies, current medications, past family history, past medical history, past social history, past surgical history and problem list.   Objective:   Vitals:   07/20/20 1614  BP: 126/83  Pulse: 98  Weight: 189 lb 14.4 oz (86.1 kg)    Fetal Status: Fetal Heart Rate (bpm): 155 Fundal Height: 28 cm Movement: Present     General:  Alert, oriented and cooperative. Patient is in no acute distress.  Skin: Skin is warm and dry. No rash noted.   Cardiovascular: Normal heart rate noted  Respiratory: Normal respiratory effort, no problems with respiration noted  Abdomen: Soft, gravid, appropriate for gestational age.  Pain/Pressure: Absent     Pelvic: Cervical exam deferred        Extremities: Normal range of motion.  Edema: None  Mental Status: Normal mood and affect. Normal behavior. Normal judgment and thought content.   Assessment and Plan:  Pregnancy: G3P2002 at [redacted]w[redacted]d 1. Supervision of high risk pregnancy in third trimester Patient is doing well without complaints TDAP today - Tdap vaccine greater than or equal to 7yo IM  2. Diabetes mellitus affecting pregnancy in third trimester CBS reviewed and fasting persistently elevated as high as 123, pp are within range Will start NPH 15 units at bedtime. Patient opted  to monitor evening diet for a few days to see if it will improve her readings. She agrees to start insulin Friday evening if fasting remains elevated  3. Multigravida of advanced maternal age in third trimester   4. Maternal obesity affecting pregnancy, antepartum Continue ASA Follow up growth ultrasound and antenatal testing  5. History of gestational hypertension Normotensive  Preterm labor symptoms and general obstetric precautions including but not limited to vaginal bleeding, contractions, leaking of fluid and fetal movement were reviewed in detail with the patient. Please refer to After Visit Summary for other counseling recommendations.   Return in about 2 weeks (around 08/03/2020) for in person, ROB, High risk.  Future Appointments  Date Time Provider [place]  08/04/2020  8:15 AM Mehul Rudin, Vickii Chafe, MD [place] None  08/05/2020  9:30 AM [facility] NURSE [facility]  08/05/2020  9:45 AM [facility] US5 [facility] [place]    Mora Bellman, MD

## 2020-07-20 NOTE — Progress Notes (Signed)
Pt is here for ROB, [redacted]w[redacted]d.

## 2020-07-25 ENCOUNTER — Other Ambulatory Visit: Payer: Self-pay

## 2020-07-25 MED ORDER — "INSULIN SYRINGE 29G X 1"" 0.3 ML MISC": 1.0000 | 0 refills | Status: DC | PRN

## 2020-07-25 NOTE — Progress Notes (Signed)
Insulin syringes ordered for patient.

## 2020-08-04 ENCOUNTER — Encounter: Payer: Self-pay | Admitting: Obstetrics and Gynecology

## 2020-08-04 ENCOUNTER — Telehealth (INDEPENDENT_AMBULATORY_CARE_PROVIDER_SITE_OTHER): Payer: 59 | Admitting: Obstetrics and Gynecology

## 2020-08-04 DIAGNOSIS — O09523 Supervision of elderly multigravida, third trimester: Secondary | ICD-10-CM

## 2020-08-04 DIAGNOSIS — O99210 Obesity complicating pregnancy, unspecified trimester: Secondary | ICD-10-CM

## 2020-08-04 DIAGNOSIS — Z8759 Personal history of other complications of pregnancy, childbirth and the puerperium: Secondary | ICD-10-CM

## 2020-08-04 DIAGNOSIS — O24913 Unspecified diabetes mellitus in pregnancy, third trimester: Secondary | ICD-10-CM

## 2020-08-04 DIAGNOSIS — O0993 Supervision of high risk pregnancy, unspecified, third trimester: Secondary | ICD-10-CM

## 2020-08-04 NOTE — Progress Notes (Signed)
Pt is on the phone preparing for virtual visit with provider, [redacted]w[redacted]d.

## 2020-08-04 NOTE — Progress Notes (Signed)
OBSTETRICS PRENATAL VIRTUAL VISIT ENCOUNTER NOTE  Provider location: [facility] [place]   I connected with Fabio Pierce on 08/04/20 at  8:15 AM EDT by MyChart Video Encounter at home and verified that I am speaking with the correct person using two identifiers.   I discussed the limitations, risks, security and privacy concerns of performing an evaluation and management service virtually and the availability of in person appointments. I also discussed with the patient that there may be a patient responsible charge related to this service. The patient expressed understanding and agreed to proceed. Subjective:  Diane Snyder is a 39 y.o. G3P2002 at [redacted]w[redacted]d being seen today for ongoing prenatal care.  She is currently monitored for the following issues for this high-risk pregnancy and has Chest pain; Supervision of high-risk pregnancy; AMA (advanced maternal age) multigravida 42+; Maternal obesity affecting pregnancy, antepartum; History of gestational hypertension; and Diabetes mellitus affecting pregnancy on their problem list.  Patient reports no complaints.  Contractions: Irritability. Vag. Bleeding: None.  Movement: Present. Denies any leaking of fluid.   The following portions of the patient's history were reviewed and updated as appropriate: allergies, current medications, past family history, past medical history, past social history, past surgical history and problem list.   Objective:  There were no vitals filed for this visit.  Fetal Status:     Movement: Present     General:  Alert, oriented and cooperative. Patient is in no acute distress.  Respiratory: Normal respiratory effort, no problems with respiration noted  Mental Status: Normal mood and affect. Normal behavior. Normal judgment and thought content.  Rest of physical exam deferred due to type of encounter  Imaging: [place] MFM OB FOLLOW UP  Result Date:  07/08/2020 ----------------------------------------------------------------------  OBSTETRICS REPORT                       (Signed Final 07/08/2020 10:35 am) ---------------------------------------------------------------------- Patient Info  ID #:       [id number]                          D.O.B.:  September 16, 1981 (39 yrs)  Name:       Diane Snyder              Visit Date: 07/08/2020 09:30 am ---------------------------------------------------------------------- Performed By  Attending:        Tama High MD        Ref. Address:     Faculty  Performed By:     Hubert Azure          [facility]                                                             [place] [facility]  Referred By:      Mora Bellman MD ---------------------------------------------------------------------- Orders  #  Description                           Code        Ordered By  1  [place] MFM OB FOLLOW UP                   [id number]    Sander Nephew ----------------------------------------------------------------------  #  Order #                     Accession #                Episode #  1  [id number]                   [id number]                 [id number] ---------------------------------------------------------------------- Indications  [redacted] weeks gestation of pregnancy                Z3A.26  Gestational diabetes in pregnancy,             O24.415  controlled by oral hypoglycemic drugs  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)  Poor obstetric history: Previous               O09.299  preeclampsia / eclampsia/gestational HTN  Advanced maternal age multigravida 36+,        O67.522  second trimester  Obesity complicating pregnancy, second         O99.212  trimester (BMI 33)  ---------------------------------------------------------------------- Fetal Evaluation  Num Of Fetuses:         1  Fetal Heart Rate(bpm):  153  Cardiac Activity:       Observed  Presentation:           Cephalic  Amniotic Fluid  AFI FV:      Within normal limits                              Largest Pocket(cm)                              6.76 ---------------------------------------------------------------------- Biometry  BPD:      68.9  mm     G. Age:  27w 5d         68  %    CI:        75.16   %    70 - 86                                                          FL/HC:  20.2   %    18.6 - 20.4  HC:      252.1  mm     G. Age:  27w 3d         78  %    HC/AC:      1.04        1.05 - 1.21  AC:      241.9  mm     G. Age:  28w 3d         86  %    FL/BPD:     73.7   %    71 - 87  FL:       50.8  mm     G. Age:  27w 2d         47  %    FL/AC:      21.0   %    20 - 24  LV:        3.8  mm  Est. FW:    1142  gm      2 lb 8 oz     79  % ---------------------------------------------------------------------- OB History  Gravidity:    3         Term:   2  Living:       2 ---------------------------------------------------------------------- Gestational Age  U/S Today:     27w 5d                                        EDD:   10/02/20  Best:          26w 6d     Det. By:  Previous Ultrasound      EDD:   10/08/20                                      (03/16/20) ---------------------------------------------------------------------- Anatomy  Cranium:               Appears normal         Aortic Arch:            Appears normal  Cavum:                 Appears normal         Ductal Arch:            Previously seen  Ventricles:            Appears normal         Diaphragm:              Appears normal  Choroid Plexus:        Previously seen        Stomach:                Appears normal, left                                                                        sided  Cerebellum:            Previously seen  Abdomen:                 Appears normal  Posterior Fossa:       Previously seen        Abdominal Wall:         Previously seen  Nuchal Fold:           Previously seen        Cord Vessels:           Previously seen  Face:                  Orbits and profile     Kidneys:                Appear normal                         previously seen  Lips:                  Previously seen        Bladder:                Appears normal  Thoracic:              Appears normal         Spine:                  Appears normal  Heart:                 Appears normal         Upper Extremities:      Previously seen                         (4CH, axis, and                         situs)  RVOT:                  Appears normal         Lower Extremities:      Previously seen  LVOT:                  Appears normal  Other:  Heels and 5th digit visualized. Technically difficult due to fetal position. ---------------------------------------------------------------------- Impression  Patient returns for fetal growth assessment.  She has  gestational diabetes and recently her Metformin dosage was  increased to 1000 mg at night.  She reports her fasting levels  are higher (130 mg/DL today).  Fetal growth is appropriate for gestational age.  Amniotic fluid  is normal and good fetal activity seen.  Both kidneys appear  normal.  I briefly discussed medical treatment for gestational diabetes.  Her blood glucose is not well controlled with Metformin she  should start taking insulin. ---------------------------------------------------------------------- Recommendations  -An appointment was made for her to return in 4 weeks for  fetal growth assessment.  -Weekly BPP from 32 weeks till delivery. ----------------------------------------------------------------------                  Tama High, MD Electronically Signed Final Report   07/08/2020 10:35 am ----------------------------------------------------------------------   Assessment and Plan:  Pregnancy: [id number] at [redacted]w[redacted]d 1.  Multigravida of advanced maternal age in third trimester Low risks genetic screening   2. Maternal obesity affecting pregnancy, antepartum Continue ASA  3. History of gestational hypertension Patient to come in  today for BP check as home reading is inacurate  4. Supervision of high risk pregnancy in third trimester Patient is doing well without complaints Patient desires BTL. Patient to sign BTL form today during BP reading  5. Diabetes mellitus affecting pregnancy in third trimester CBGs fasting 99-102  Pp 107-115 NPH increased to 17 units from 15 units Follow up growth ultrasound 9/10  Preterm labor symptoms and general obstetric precautions including but not limited to vaginal bleeding, contractions, leaking of fluid and fetal movement were reviewed in detail with the patient. I discussed the assessment and treatment plan with the patient. The patient was provided an opportunity to ask questions and all were answered. The patient agreed with the plan and demonstrated an understanding of the instructions. The patient was advised to call back or seek an in-person office evaluation/go to MAU at [facility] for any urgent or concerning symptoms. Please refer to After Visit Summary for other counseling recommendations.   I provided 15 minutes of face-to-face time during this encounter.  Return in about 2 weeks (around 08/18/2020) for in person, ROB, High risk.  Future Appointments  Date Time Provider [place]  08/05/2020  9:30 AM [facility] NURSE [facility]  08/05/2020  9:45 AM [facility] US4 [facility] [place]    Mora Bellman, MD [facility], [place]

## 2020-08-05 ENCOUNTER — Other Ambulatory Visit: Payer: Self-pay | Admitting: *Deleted

## 2020-08-05 ENCOUNTER — Ambulatory Visit: Payer: 59 | Admitting: *Deleted

## 2020-08-05 ENCOUNTER — Other Ambulatory Visit: Payer: Self-pay

## 2020-08-05 ENCOUNTER — Ambulatory Visit: Payer: 59 | Attending: Obstetrics and Gynecology

## 2020-08-05 ENCOUNTER — Encounter: Payer: Self-pay | Admitting: *Deleted

## 2020-08-05 DIAGNOSIS — O09523 Supervision of elderly multigravida, third trimester: Secondary | ICD-10-CM

## 2020-08-05 DIAGNOSIS — O99213 Obesity complicating pregnancy, third trimester: Secondary | ICD-10-CM

## 2020-08-05 DIAGNOSIS — D259 Leiomyoma of uterus, unspecified: Secondary | ICD-10-CM

## 2020-08-05 DIAGNOSIS — Z8759 Personal history of other complications of pregnancy, childbirth and the puerperium: Secondary | ICD-10-CM | POA: Diagnosis present

## 2020-08-05 DIAGNOSIS — Z3A30 30 weeks gestation of pregnancy: Secondary | ICD-10-CM

## 2020-08-05 DIAGNOSIS — O09293 Supervision of pregnancy with other poor reproductive or obstetric history, third trimester: Secondary | ICD-10-CM

## 2020-08-05 DIAGNOSIS — O24410 Gestational diabetes mellitus in pregnancy, diet controlled: Secondary | ICD-10-CM | POA: Diagnosis present

## 2020-08-05 DIAGNOSIS — O24414 Gestational diabetes mellitus in pregnancy, insulin controlled: Secondary | ICD-10-CM

## 2020-08-05 DIAGNOSIS — O99210 Obesity complicating pregnancy, unspecified trimester: Secondary | ICD-10-CM | POA: Diagnosis present

## 2020-08-05 DIAGNOSIS — O3413 Maternal care for benign tumor of corpus uteri, third trimester: Secondary | ICD-10-CM

## 2020-08-05 DIAGNOSIS — Z794 Long term (current) use of insulin: Secondary | ICD-10-CM

## 2020-08-05 DIAGNOSIS — Z148 Genetic carrier of other disease: Secondary | ICD-10-CM

## 2020-08-12 ENCOUNTER — Ambulatory Visit: Payer: 59 | Admitting: *Deleted

## 2020-08-12 ENCOUNTER — Ambulatory Visit: Payer: 59 | Attending: Obstetrics and Gynecology | Admitting: *Deleted

## 2020-08-12 ENCOUNTER — Other Ambulatory Visit: Payer: Self-pay

## 2020-08-12 DIAGNOSIS — Z3A31 31 weeks gestation of pregnancy: Secondary | ICD-10-CM

## 2020-08-12 DIAGNOSIS — Z3A00 Weeks of gestation of pregnancy not specified: Secondary | ICD-10-CM | POA: Diagnosis not present

## 2020-08-12 DIAGNOSIS — Z8759 Personal history of other complications of pregnancy, childbirth and the puerperium: Secondary | ICD-10-CM

## 2020-08-12 DIAGNOSIS — O24414 Gestational diabetes mellitus in pregnancy, insulin controlled: Secondary | ICD-10-CM | POA: Diagnosis present

## 2020-08-12 DIAGNOSIS — O99210 Obesity complicating pregnancy, unspecified trimester: Secondary | ICD-10-CM

## 2020-08-12 NOTE — Procedures (Signed)
Diane Snyder 09-22-1981 [redacted]w[redacted]d  Fetus A Non-Stress Test Interpretation for 08/12/20  Indication: Gestational Diabetes medication controlled  Fetal Heart Rate A Mode: External Baseline Rate (A): 145 bpm Variability: Moderate Accelerations: 10 x 10 Decelerations: None Multiple birth?: No  Uterine Activity Mode: Palpation, Toco Contraction Frequency (min): None Resting Tone Palpated: Relaxed Resting Time: Adequate  Interpretation (Fetal Testing) Nonstress Test Interpretation: Reactive Overall Impression: Reassuring for gestational age Comments: Dr. Annamaria Boots reviewed tracing

## 2020-08-19 ENCOUNTER — Encounter: Payer: Self-pay | Admitting: Obstetrics and Gynecology

## 2020-08-19 ENCOUNTER — Ambulatory Visit (INDEPENDENT_AMBULATORY_CARE_PROVIDER_SITE_OTHER): Payer: 59 | Admitting: Obstetrics and Gynecology

## 2020-08-19 ENCOUNTER — Ambulatory Visit: Payer: 59 | Attending: Obstetrics and Gynecology

## 2020-08-19 ENCOUNTER — Ambulatory Visit: Payer: 59

## 2020-08-19 ENCOUNTER — Other Ambulatory Visit: Payer: Self-pay

## 2020-08-19 VITALS — BP 125/81 | HR 99 | Wt 193.0 lb

## 2020-08-19 DIAGNOSIS — O3413 Maternal care for benign tumor of corpus uteri, third trimester: Secondary | ICD-10-CM

## 2020-08-19 DIAGNOSIS — O99213 Obesity complicating pregnancy, third trimester: Secondary | ICD-10-CM | POA: Diagnosis not present

## 2020-08-19 DIAGNOSIS — O99210 Obesity complicating pregnancy, unspecified trimester: Secondary | ICD-10-CM

## 2020-08-19 DIAGNOSIS — O24913 Unspecified diabetes mellitus in pregnancy, third trimester: Secondary | ICD-10-CM

## 2020-08-19 DIAGNOSIS — Z8759 Personal history of other complications of pregnancy, childbirth and the puerperium: Secondary | ICD-10-CM

## 2020-08-19 DIAGNOSIS — Z148 Genetic carrier of other disease: Secondary | ICD-10-CM

## 2020-08-19 DIAGNOSIS — O24414 Gestational diabetes mellitus in pregnancy, insulin controlled: Secondary | ICD-10-CM | POA: Insufficient documentation

## 2020-08-19 DIAGNOSIS — D259 Leiomyoma of uterus, unspecified: Secondary | ICD-10-CM

## 2020-08-19 DIAGNOSIS — O0993 Supervision of high risk pregnancy, unspecified, third trimester: Secondary | ICD-10-CM

## 2020-08-19 DIAGNOSIS — O09523 Supervision of elderly multigravida, third trimester: Secondary | ICD-10-CM

## 2020-08-19 DIAGNOSIS — O09293 Supervision of pregnancy with other poor reproductive or obstetric history, third trimester: Secondary | ICD-10-CM | POA: Diagnosis not present

## 2020-08-19 DIAGNOSIS — E669 Obesity, unspecified: Secondary | ICD-10-CM

## 2020-08-19 DIAGNOSIS — Z3A32 32 weeks gestation of pregnancy: Secondary | ICD-10-CM

## 2020-08-19 NOTE — Progress Notes (Signed)
   PRENATAL VISIT NOTE  Subjective:  Diane Snyder is a 39 y.o. G3P2002 at [redacted]w[redacted]d being seen today for ongoing prenatal care.  She is currently monitored for the following issues for this high-risk pregnancy and has Chest pain; Supervision of high-risk pregnancy; AMA (advanced maternal age) multigravida 61+; Maternal obesity affecting pregnancy, antepartum; History of gestational hypertension; and Diabetes mellitus affecting pregnancy on their problem list.  Patient reports no complaints.  Contractions: Irritability. Vag. Bleeding: None.  Movement: Present. Denies leaking of fluid.   The following portions of the patient's history were reviewed and updated as appropriate: allergies, current medications, past family history, past medical history, past social history, past surgical history and problem list.   Objective:   Vitals:   08/19/20 0900  BP: 125/81  Pulse: 99  Weight: 193 lb (87.5 kg)    Fetal Status: Fetal Heart Rate (bpm): 148   Movement: Present     General:  Alert, oriented and cooperative. Patient is in no acute distress.  Skin: Skin is warm and dry. No rash noted.   Cardiovascular: Normal heart rate noted  Respiratory: Normal respiratory effort, no problems with respiration noted  Abdomen: Soft, gravid, appropriate for gestational age.  Pain/Pressure: Present     Pelvic: Cervical exam deferred        Extremities: Normal range of motion.  Edema: None  Mental Status: Normal mood and affect. Normal behavior. Normal judgment and thought content.   Assessment and Plan:  Pregnancy: G3P2002 at [redacted]w[redacted]d 1. Supervision of high risk pregnancy in third trimester Patient is doing well without complaints  2. Diabetes mellitus affecting pregnancy in third trimester CBGs reviewed and pp within range and most fasting elevated Patient admits to consuming snack in middle of the night. Advised to consume a protein rich snack at bedtime instead Continue current insulin dosage  3.  Multigravida of advanced maternal age in third trimester   4. Maternal obesity affecting pregnancy, antepartum Continue ASA  5. History of gestational hypertension Normotensive and asy,ptomatic  Preterm labor symptoms and general obstetric precautions including but not limited to vaginal bleeding, contractions, leaking of fluid and fetal movement were reviewed in detail with the patient. Please refer to After Visit Summary for other counseling recommendations.   Return in about 2 weeks (around 09/02/2020) for in person, ROB, High risk.  Future Appointments  Date Time Provider [place]  08/25/2020 10:00 AM [facility] NURSE [facility]  08/25/2020 10:15 AM [facility] US2 [facility]  09/02/2020  8:45 AM [facility] NURSE [facility] [place]  09/02/2020  9:00 AM [facility] US1 [facility]    Mora Bellman, MD

## 2020-08-19 NOTE — Progress Notes (Signed)
ROB [redacted]w[redacted]d  Pt brought log of sugars today  CC: none

## 2020-08-23 ENCOUNTER — Telehealth: Payer: Self-pay | Admitting: Family Medicine

## 2020-08-23 ENCOUNTER — Encounter (HOSPITAL_COMMUNITY): Payer: Self-pay | Admitting: Family Medicine

## 2020-08-23 ENCOUNTER — Other Ambulatory Visit: Payer: Self-pay

## 2020-08-23 ENCOUNTER — Inpatient Hospital Stay (HOSPITAL_COMMUNITY)
Admission: AD | Admit: 2020-08-23 | Discharge: 2020-08-24 | Disposition: A | Discharge status: Home / Self Care | Payer: 59 | Attending: Family Medicine | Admitting: Family Medicine

## 2020-08-23 DIAGNOSIS — Z9049 Acquired absence of other specified parts of digestive tract: Secondary | ICD-10-CM | POA: Insufficient documentation

## 2020-08-23 DIAGNOSIS — Z7982 Long term (current) use of aspirin: Secondary | ICD-10-CM | POA: Insufficient documentation

## 2020-08-23 DIAGNOSIS — O163 Unspecified maternal hypertension, third trimester: Secondary | ICD-10-CM | POA: Diagnosis not present

## 2020-08-23 DIAGNOSIS — Z87891 Personal history of nicotine dependence: Secondary | ICD-10-CM | POA: Diagnosis not present

## 2020-08-23 DIAGNOSIS — Z8249 Family history of ischemic heart disease and other diseases of the circulatory system: Secondary | ICD-10-CM | POA: Diagnosis not present

## 2020-08-23 DIAGNOSIS — Z3A33 33 weeks gestation of pregnancy: Secondary | ICD-10-CM | POA: Insufficient documentation

## 2020-08-23 DIAGNOSIS — O09523 Supervision of elderly multigravida, third trimester: Secondary | ICD-10-CM | POA: Insufficient documentation

## 2020-08-23 DIAGNOSIS — O26893 Other specified pregnancy related conditions, third trimester: Secondary | ICD-10-CM | POA: Insufficient documentation

## 2020-08-23 DIAGNOSIS — O99891 Other specified diseases and conditions complicating pregnancy: Secondary | ICD-10-CM | POA: Diagnosis not present

## 2020-08-23 DIAGNOSIS — Z794 Long term (current) use of insulin: Secondary | ICD-10-CM | POA: Diagnosis not present

## 2020-08-23 DIAGNOSIS — Z79899 Other long term (current) drug therapy: Secondary | ICD-10-CM | POA: Insufficient documentation

## 2020-08-23 DIAGNOSIS — Z833 Family history of diabetes mellitus: Secondary | ICD-10-CM | POA: Insufficient documentation

## 2020-08-23 DIAGNOSIS — R030 Elevated blood-pressure reading, without diagnosis of hypertension: Secondary | ICD-10-CM | POA: Diagnosis not present

## 2020-08-23 LAB — COMPREHENSIVE METABOLIC PANEL
ALT: 10 U/L (ref 0–44)
AST: 20 U/L (ref 15–41)
Albumin: 2.8 g/dL — ABNORMAL LOW (ref 3.5–5.0)
Alkaline Phosphatase: 103 U/L (ref 38–126)
Anion gap: 10 (ref 5–15)
BUN: <5 mg/dL — ABNORMAL LOW (ref 6–20)
CO2: 20 mmol/L — ABNORMAL LOW (ref 22–32)
Calcium: 9.3 mg/dL (ref 8.9–10.3)
Chloride: 106 mmol/L (ref 98–111)
Creatinine, Ser: 0.58 mg/dL (ref 0.44–1.00)
GFR calc Af Amer: >60 mL/min (ref >60)
GFR calc non Af Amer: >60 mL/min (ref >60)
Glucose, Bld: 111 mg/dL — ABNORMAL HIGH (ref 70–99)
Potassium: 4 mmol/L (ref 3.5–5.1)
Sodium: 136 mmol/L (ref 135–145)
Total Bilirubin: 0.5 mg/dL (ref 0.3–1.2)
Total Protein: 6.5 g/dL (ref 6.5–8.1)

## 2020-08-23 LAB — CBC
HCT: 35.3 % — ABNORMAL LOW (ref 36.0–46.0)
Hemoglobin: 11 g/dL — ABNORMAL LOW (ref 12.0–15.0)
MCH: 25.7 pg — ABNORMAL LOW (ref 26.0–34.0)
MCHC: 31.2 g/dL (ref 30.0–36.0)
MCV: 82.5 fL (ref 80.0–100.0)
Platelets: 198 10*3/uL (ref 150–400)
RBC: 4.28 MIL/uL (ref 3.87–5.11)
RDW: 15.6 % — ABNORMAL HIGH (ref 11.5–15.5)
WBC: 6.8 10*3/uL (ref 4.0–10.5)
nRBC: 0 % (ref 0.0–0.2)

## 2020-08-23 LAB — PROTEIN / CREATININE RATIO, URINE
Creatinine, Urine: 67.18 mg/dL
Protein Creatinine Ratio: 0.15 mg/mg{Cre} (ref 0.00–0.15)
Total Protein, Urine: 10 mg/dL

## 2020-08-23 NOTE — MAU Provider Note (Signed)
Chief Complaint:  Hypertension   First Provider Initiated Contact with Patient 08/23/20 2225     HPI: Diane Snyder is a 39 y.o. G3P2002 at 39w3dho presents to maternity admissions reporting elevated Blood Pressure on home wrist cuff.  States she thinks it was because she was holding her arm across her chest while measuring.  Pregnancy has been remarkable for diabetes requiring insulin.  FBS this am was 98. . She reports good fetal movement, denies LOF, vaginal bleeding, vaginal itching/burning, urinary symptoms, h/a, dizziness, n/v, diarrhea, constipation or fever/chills.  She denies headache, visual changes or RUQ abdominal pain.  Hypertension This is a new problem. The current episode started today. Pertinent negatives include no blurred vision, chest pain, headaches, peripheral edema or shortness of breath. There are no associated agents to hypertension. Risk factors for coronary artery disease include diabetes mellitus. Past treatments include nothing. There are no compliance problems.      Past Medical History: Past Medical History:  Diagnosis Date  . Diverticulosis   . GDM (gestational diabetes mellitus) 03/2020  . Gestational diabetes     Past obstetric history: OB History  Gravida Para Term Preterm AB Living  '3 2 2     2  ' SAB TAB Ectopic Multiple Live Births          2    # Outcome Date GA Lbr Len/2nd Weight Sex Delivery Anes PTL Lv  3 Current           2 Term 02/16/08 [id number] g F Vag-Spont None  LIV  1 Term 10/11/06 [id number] g M Vag-Spont EPI  LIV    Past Surgical History: Past Surgical History:  Procedure Laterality Date  . CHOLECYSTECTOMY      Family History: Family History  Problem Relation Age of Onset  . Hypertension Mother   . Heart disease Maternal Grandmother   . Hypertension Maternal Grandmother   . Diabetes Maternal Grandmother   . Stroke Maternal Grandfather   . Heart disease Maternal Grandfather   . Heart attack Maternal Grandfather      Social History: Social History   Tobacco Use  . Smoking status: Former [employer] scientist (life sciences) Smokeless tobacco: Never Used  Vaping Use  . Vaping Use: Never used  Substance Use Topics  . Alcohol use: Not Currently  . Drug use: No    Allergies:  Allergies  Allergen Reactions  . Contrast Media [Iodinated Diagnostic Agents]     Meds:  Facility-Administered Medications Prior to Admission  Medication Dose Route Frequency Provider Last Rate Last Admin  . insulin starter kit- syringes (English) 1 kit  1 kit Other Once Constant, Peggy, MD      . metFORMIN (GLUCOPHAGE) tablet 1,000 mg  1,000 mg Oral QHS ArnWoodroe ModeD       Medications Prior to Admission  Medication Sig Dispense Refill Last Dose  . aspirin EC 81 MG tablet Take 1 tablet (81 mg total) by mouth daily. Take after 12 weeks for prevention of preeclampsia later in pregnancy 300 tablet 2 08/22/2020 at Unknown time  . insulin regular (NOVOLIN R) 100 units/mL injection Inject 17 Units into the skin once.   08/23/2020 at 2000  . Prenatal MV-Min-FA-Omega-3 (PRENATAL GUMMIES/DHA & FA PO) Take by mouth.   08/23/2020 at Unknown time  . Accu-Chek Softclix Lancets lancets Use as instructed 100 each 12   . Acetaminophen (TYLENOL PO) Take by mouth as needed.  (Patient not taking: Reported on 08/19/2020)     .  Blood Glucose Monitoring Suppl (ACCU-CHEK GUIDE) w/Device KIT 1 Device by Does not apply route in the morning, at noon, in the evening, and at bedtime. 1 kit 0   . Blood Pressure Monitoring (BLOOD PRESSURE KIT) DEVI 1 kit by Does not apply route once a week. Check Blood Pressure regularly and record readings into the [employer] App.  Large Cuff.  DX O90.0 1 each 0   . glucose blood (ACCU-CHEK GUIDE) test strip Use to check blood sugars four times a day was instructed 50 each 12   . insulin NPH Human (HUMULIN N) 100 UNIT/ML injection Inject 0.15 mLs (15 Units total) into the skin at bedtime. 10 mL 11   . Insulin Syringe-Needle U-100 (INSULIN  SYRINGE .3CC/29GX1") 29G X 1" 0.3 ML MISC 1 Device by Does not apply route as needed. 60 each 0   . metFORMIN (GLUCOPHAGE) 500 MG tablet Take by mouth 2 (two) times daily with a meal.  (Patient not taking: Reported on 08/19/2020)     . metoCLOPramide (REGLAN) 5 MG tablet Take 1 tablet (5 mg total) by mouth 4 (four) times daily -  before meals and at bedtime. 60 tablet 2 More than a month at Unknown time  . pantoprazole (PROTONIX) 40 MG tablet Take 1 tablet (40 mg total) by mouth daily. 30 tablet 1 More than a month at Unknown time    I have reviewed patient's Past Medical Hx, Surgical Hx, Family Hx, Social Hx, medications and allergies.   ROS:  Review of Systems  Constitutional: Negative for chills and fever.  Eyes: Negative for blurred vision.  Respiratory: Negative for shortness of breath.   Cardiovascular: Negative for chest pain.  Genitourinary: Negative for pelvic pain and vaginal bleeding.  Neurological: Negative for dizziness and headaches.   Other systems negative  Physical Exam   Patient Vitals for the past 24 hrs:  BP Temp Temp src Pulse Resp Height Weight  08/23/20 2210 127/82 -- -- 99 -- -- --  08/23/20 2148 117/72 98.3 F (36.8 C) Oral (!) 102 20 '5\' 6"'  (1.676 m) 87.2 kg   BP while I was in room 131/89 Vitals:   08/23/20 2211 08/23/20 2216 08/23/20 2231 08/23/20 2245  BP: 127/85 127/84 131/89 134/85  Pulse: 100 (!) 101 94 92  Resp:      Temp:      TempSrc:      Weight:      Height:        Constitutional: Well-developed, well-nourished female in no acute distress.  Cardiovascular: normal rate and rhythm Respiratory: normal effort, clear to auscultation bilaterally GI: Abd soft, non-tender, gravid appropriate for gestational age.   No rebound or guarding. MS: Extremities nontender, no edema, normal ROM Neurologic: Alert and oriented x 4. DTRs 2+ GU: Neg CVAT.  PELVIC EXAM:  deferred  FHT:  Baseline 145 , moderate variability, accelerations present, no  decelerations Contractions: Occasional   Labs: Results for orders placed or performed during the hospital encounter of 08/23/20 (from the past 24 hour(s))  Protein / creatinine ratio, urine     Status: None   Collection Time: 08/23/20  9:42 PM  Result Value Ref Range   Creatinine, Urine 67.18 mg/dL   Total Protein, Urine 10 mg/dL   Protein Creatinine Ratio 0.15 0.00 - 0.15 mg/mg[Cre]  CBC     Status: Abnormal   Collection Time: 08/23/20 10:41 PM  Result Value Ref Range   WBC 6.8 4.0 - 10.5 K/uL   RBC 4.28 3.87 -  5.11 MIL/uL   Hemoglobin 11.0 (L) 12.0 - 15.0 g/dL   HCT 35.3 (L) 36 - 46 %   MCV 82.5 80.0 - 100.0 fL   MCH 25.7 (L) 26.0 - 34.0 pg   MCHC 31.2 30.0 - 36.0 g/dL   RDW 15.6 (H) 11.5 - 15.5 %   Platelets 198 150 - 400 K/uL   nRBC 0.0 0.0 - 0.2 %  Comprehensive metabolic panel     Status: Abnormal   Collection Time: 08/23/20 10:41 PM  Result Value Ref Range   Sodium 136 135 - 145 mmol/L   Potassium 4.0 3.5 - 5.1 mmol/L   Chloride 106 98 - 111 mmol/L   CO2 20 (L) 22 - 32 mmol/L   Glucose, Bld 111 (H) 70 - 99 mg/dL   BUN <5 (L) 6 - 20 mg/dL   Creatinine, Ser 0.58 0.44 - 1.00 mg/dL   Calcium 9.3 8.9 - 10.3 mg/dL   Total Protein 6.5 6.5 - 8.1 g/dL   Albumin 2.8 (L) 3.5 - 5.0 g/dL   AST 20 15 - 41 U/L   ALT 10 0 - 44 U/L   Alkaline Phosphatase 103 38 - 126 U/L   Total Bilirubin 0.5 0.3 - 1.2 mg/dL   GFR calc non Af Amer >60 >60 mL/min   GFR calc Af Amer >60 >60 mL/min   Anion gap 10 5 - 15    O/Positive/-- (05/05 1445)  Imaging:    MAU Course/MDM: Initial BPs were normal but as I was doing her exam, BP noted to be 131/89 So  Labs were ordered, in order to have an initial baseline if she develops more significant hypertension as time goes on. I have ordered labs and reviewed results. >>  Labs normal  NST reviewed, reactive  Treatments in MAU included EFM.    Assessment: Single IUP at 38w4dBorderline hypertension No evidence for  preeclampsia  Plan: Discharge home Patient to bring cuff to office for calibration Preterm Labor precautions and fetal kick counts Follow up in Office for prenatal visits   Encouraged to return here or to other Urgent Care/ED if she develops worsening of symptoms, increase in pain, fever, or other concerning symptoms.   Pt stable at time of discharge.  MHansel FeinsteinCNM, MSN Certified Nurse-Midwife 08/23/2020 10:38 PM

## 2020-08-23 NOTE — Discharge Instructions (Signed)

## 2020-08-23 NOTE — MAU Note (Signed)
PT SAYS WAS IN OFFICE - GIVEN A WRIST BP CUFF- WAS UNSURE OF HOW TO USE IT- GOT HIGH READINGS AT HOME.  DENIES H/A , BLURRED VISION OR EPIGASTRIC  PAIN.

## 2020-08-23 NOTE — Telephone Encounter (Signed)
Called by [facility] - Severe range BP 190/116. Had floaters earlier. Patient to come to MAU for eval

## 2020-08-24 DIAGNOSIS — Z3A33 33 weeks gestation of pregnancy: Secondary | ICD-10-CM

## 2020-08-24 DIAGNOSIS — R030 Elevated blood-pressure reading, without diagnosis of hypertension: Secondary | ICD-10-CM | POA: Diagnosis not present

## 2020-08-24 DIAGNOSIS — O99891 Other specified diseases and conditions complicating pregnancy: Secondary | ICD-10-CM | POA: Diagnosis not present

## 2020-08-25 ENCOUNTER — Other Ambulatory Visit: Payer: Self-pay

## 2020-08-25 ENCOUNTER — Ambulatory Visit: Payer: 59 | Admitting: *Deleted

## 2020-08-25 ENCOUNTER — Other Ambulatory Visit: Payer: Self-pay | Admitting: *Deleted

## 2020-08-25 ENCOUNTER — Ambulatory Visit: Payer: 59 | Attending: Obstetrics and Gynecology

## 2020-08-25 DIAGNOSIS — O09523 Supervision of elderly multigravida, third trimester: Secondary | ICD-10-CM | POA: Insufficient documentation

## 2020-08-25 DIAGNOSIS — O09293 Supervision of pregnancy with other poor reproductive or obstetric history, third trimester: Secondary | ICD-10-CM

## 2020-08-25 DIAGNOSIS — O24414 Gestational diabetes mellitus in pregnancy, insulin controlled: Secondary | ICD-10-CM

## 2020-08-25 DIAGNOSIS — R030 Elevated blood-pressure reading, without diagnosis of hypertension: Secondary | ICD-10-CM | POA: Insufficient documentation

## 2020-08-25 DIAGNOSIS — O3413 Maternal care for benign tumor of corpus uteri, third trimester: Secondary | ICD-10-CM

## 2020-08-25 DIAGNOSIS — O99210 Obesity complicating pregnancy, unspecified trimester: Secondary | ICD-10-CM | POA: Insufficient documentation

## 2020-08-25 DIAGNOSIS — O99213 Obesity complicating pregnancy, third trimester: Secondary | ICD-10-CM | POA: Diagnosis not present

## 2020-08-25 DIAGNOSIS — Z3A33 33 weeks gestation of pregnancy: Secondary | ICD-10-CM

## 2020-08-25 DIAGNOSIS — Z8759 Personal history of other complications of pregnancy, childbirth and the puerperium: Secondary | ICD-10-CM | POA: Insufficient documentation

## 2020-08-25 DIAGNOSIS — D259 Leiomyoma of uterus, unspecified: Secondary | ICD-10-CM

## 2020-08-25 DIAGNOSIS — Z148 Genetic carrier of other disease: Secondary | ICD-10-CM

## 2020-08-26 ENCOUNTER — Ambulatory Visit (INDEPENDENT_AMBULATORY_CARE_PROVIDER_SITE_OTHER): Payer: 59

## 2020-08-26 ENCOUNTER — Telehealth: Payer: Self-pay

## 2020-08-26 ENCOUNTER — Other Ambulatory Visit: Payer: Self-pay

## 2020-08-26 VITALS — BP 135/85 | HR 99 | Wt 190.8 lb

## 2020-08-26 DIAGNOSIS — O0993 Supervision of high risk pregnancy, unspecified, third trimester: Secondary | ICD-10-CM | POA: Diagnosis not present

## 2020-08-26 NOTE — Progress Notes (Signed)
Pt reports decreased fetal movement, reports feeling 3 "slow movements" today. NST today.  NST reactive, reassuring for gestational age. Pt instructed to go to MAU for evaluation if baby has decreased fetal movement again. Pt voices understanding.

## 2020-08-26 NOTE — Telephone Encounter (Signed)
Pt c/o DEC FM since last night. Pt tried drinking cold fluids and eating but movements are still slow.

## 2020-08-27 NOTE — Progress Notes (Signed)
Patient was assessed and managed by nursing staff during this encounter. I have reviewed the chart and agree with the documentation and plan. I have also made any necessary editorial changes.  Griffin Basil, MD 08/27/2020 9:04 AM

## 2020-09-01 ENCOUNTER — Other Ambulatory Visit: Payer: Self-pay

## 2020-09-01 ENCOUNTER — Ambulatory Visit (INDEPENDENT_AMBULATORY_CARE_PROVIDER_SITE_OTHER): Payer: 59 | Admitting: Obstetrics and Gynecology

## 2020-09-01 ENCOUNTER — Encounter: Payer: Self-pay | Admitting: Obstetrics and Gynecology

## 2020-09-01 VITALS — BP 122/87 | HR 101 | Wt 190.0 lb

## 2020-09-01 DIAGNOSIS — Z8759 Personal history of other complications of pregnancy, childbirth and the puerperium: Secondary | ICD-10-CM

## 2020-09-01 DIAGNOSIS — Z3A34 34 weeks gestation of pregnancy: Secondary | ICD-10-CM

## 2020-09-01 DIAGNOSIS — Z23 Encounter for immunization: Secondary | ICD-10-CM | POA: Diagnosis not present

## 2020-09-01 DIAGNOSIS — O24913 Unspecified diabetes mellitus in pregnancy, third trimester: Secondary | ICD-10-CM

## 2020-09-01 DIAGNOSIS — O0993 Supervision of high risk pregnancy, unspecified, third trimester: Secondary | ICD-10-CM

## 2020-09-01 DIAGNOSIS — O99210 Obesity complicating pregnancy, unspecified trimester: Secondary | ICD-10-CM

## 2020-09-01 DIAGNOSIS — O09523 Supervision of elderly multigravida, third trimester: Secondary | ICD-10-CM

## 2020-09-01 NOTE — Progress Notes (Signed)
   PRENATAL VISIT NOTE  Subjective:  Diane Snyder is a 39 y.o. G3P2002 at [redacted]w[redacted]d being seen today for ongoing prenatal care.  She is currently monitored for the following issues for this high-risk pregnancy and has Chest pain; Supervision of high-risk pregnancy; AMA (advanced maternal age) multigravida 50+; Maternal obesity affecting pregnancy, antepartum; History of gestational hypertension; Diabetes mellitus affecting pregnancy; and Borderline hypertension on their problem list.  Patient reports heartburn.  Contractions: Irritability. Vag. Bleeding: None.  Movement: Present. Denies leaking of fluid.   The following portions of the patient's history were reviewed and updated as appropriate: allergies, current medications, past family history, past medical history, past social history, past surgical history and problem list.   Objective:   Vitals:   09/01/20 0818  BP: 122/87  Pulse: (!) 101  Weight: 190 lb (86.2 kg)    Fetal Status: Fetal Heart Rate (bpm): 146   Movement: Present     General:  Alert, oriented and cooperative. Patient is in no acute distress.  Skin: Skin is warm and dry. No rash noted.   Cardiovascular: Normal heart rate noted  Respiratory: Normal respiratory effort, no problems with respiration noted  Abdomen: Soft, gravid, appropriate for gestational age.  Pain/Pressure: Present     Pelvic: Cervical exam deferred        Extremities: Normal range of motion.  Edema: None  Mental Status: Normal mood and affect. Normal behavior. Normal judgment and thought content.   Assessment and Plan:  Pregnancy: G3P2002 at [redacted]w[redacted]d 1. Supervision of high risk pregnancy in third trimester Patient is doing well Reminded patient to take protonix daily  2. Diabetes mellitus affecting pregnancy in third trimester CBGs reviewed and majority of fasting elevated as high as 100. Will increase bedtime NPH to 17 units. All pp within range with a few outliers due to dietary  indiscretions Follow up growth ultrasound this week  3. Multigravida of advanced maternal age in third trimester Low risks NIPS  4. Maternal obesity affecting pregnancy, antepartum   5. History of gestational hypertension Normotensive   Preterm labor symptoms and general obstetric precautions including but not limited to vaginal bleeding, contractions, leaking of fluid and fetal movement were reviewed in detail with the patient. Please refer to After Visit Summary for other counseling recommendations.   Return in about 2 weeks (around 09/15/2020) for in person, ROB, High risk.  Future Appointments  Date Time Provider [place]  09/02/2020  8:45 AM [facility] NURSE [facility]  09/02/2020  9:00 AM [facility] US1 [facility]  09/08/2020  1:00 PM [facility] NURSE [facility]  09/08/2020  1:15 PM [facility] US2 [facility]  09/15/2020 11:00 AM [facility] NURSE [facility]  09/15/2020 11:15 AM [facility] US2 [facility]  09/22/2020  9:30 AM [facility] NURSE [facility]  09/22/2020  9:45 AM [facility] US5 [facility]    Alayjah Boehringer, MD

## 2020-09-01 NOTE — Progress Notes (Signed)
Pt presents for ROB c/o acid reflux - has rx for Protonix agrees to start taking it for relief  CBG readings available Flu vaccine given LD without complaints  BPP/NST visit scheduled tomorrow

## 2020-09-02 ENCOUNTER — Ambulatory Visit: Payer: 59 | Admitting: *Deleted

## 2020-09-02 ENCOUNTER — Ambulatory Visit: Payer: 59 | Attending: Obstetrics and Gynecology

## 2020-09-02 DIAGNOSIS — O99210 Obesity complicating pregnancy, unspecified trimester: Secondary | ICD-10-CM | POA: Diagnosis present

## 2020-09-02 DIAGNOSIS — O09293 Supervision of pregnancy with other poor reproductive or obstetric history, third trimester: Secondary | ICD-10-CM

## 2020-09-02 DIAGNOSIS — O3413 Maternal care for benign tumor of corpus uteri, third trimester: Secondary | ICD-10-CM

## 2020-09-02 DIAGNOSIS — R030 Elevated blood-pressure reading, without diagnosis of hypertension: Secondary | ICD-10-CM

## 2020-09-02 DIAGNOSIS — O24414 Gestational diabetes mellitus in pregnancy, insulin controlled: Secondary | ICD-10-CM | POA: Diagnosis present

## 2020-09-02 DIAGNOSIS — Z3A34 34 weeks gestation of pregnancy: Secondary | ICD-10-CM

## 2020-09-02 DIAGNOSIS — O99213 Obesity complicating pregnancy, third trimester: Secondary | ICD-10-CM | POA: Diagnosis not present

## 2020-09-02 DIAGNOSIS — D259 Leiomyoma of uterus, unspecified: Secondary | ICD-10-CM

## 2020-09-02 DIAGNOSIS — O09523 Supervision of elderly multigravida, third trimester: Secondary | ICD-10-CM | POA: Insufficient documentation

## 2020-09-02 DIAGNOSIS — Z8759 Personal history of other complications of pregnancy, childbirth and the puerperium: Secondary | ICD-10-CM | POA: Insufficient documentation

## 2020-09-02 DIAGNOSIS — Z148 Genetic carrier of other disease: Secondary | ICD-10-CM

## 2020-09-02 DIAGNOSIS — Z362 Encounter for other antenatal screening follow-up: Secondary | ICD-10-CM

## 2020-09-08 ENCOUNTER — Ambulatory Visit: Payer: 59 | Attending: Obstetrics and Gynecology

## 2020-09-08 ENCOUNTER — Ambulatory Visit: Payer: 59 | Admitting: *Deleted

## 2020-09-08 ENCOUNTER — Other Ambulatory Visit: Payer: Self-pay

## 2020-09-08 DIAGNOSIS — Z148 Genetic carrier of other disease: Secondary | ICD-10-CM

## 2020-09-08 DIAGNOSIS — D259 Leiomyoma of uterus, unspecified: Secondary | ICD-10-CM

## 2020-09-08 DIAGNOSIS — R030 Elevated blood-pressure reading, without diagnosis of hypertension: Secondary | ICD-10-CM

## 2020-09-08 DIAGNOSIS — O99210 Obesity complicating pregnancy, unspecified trimester: Secondary | ICD-10-CM

## 2020-09-08 DIAGNOSIS — Z8759 Personal history of other complications of pregnancy, childbirth and the puerperium: Secondary | ICD-10-CM | POA: Diagnosis present

## 2020-09-08 DIAGNOSIS — O24414 Gestational diabetes mellitus in pregnancy, insulin controlled: Secondary | ICD-10-CM

## 2020-09-08 DIAGNOSIS — E669 Obesity, unspecified: Secondary | ICD-10-CM

## 2020-09-08 DIAGNOSIS — O09523 Supervision of elderly multigravida, third trimester: Secondary | ICD-10-CM | POA: Insufficient documentation

## 2020-09-08 DIAGNOSIS — Z362 Encounter for other antenatal screening follow-up: Secondary | ICD-10-CM

## 2020-09-08 DIAGNOSIS — O09293 Supervision of pregnancy with other poor reproductive or obstetric history, third trimester: Secondary | ICD-10-CM | POA: Diagnosis not present

## 2020-09-08 DIAGNOSIS — Z3A35 35 weeks gestation of pregnancy: Secondary | ICD-10-CM

## 2020-09-08 DIAGNOSIS — O99213 Obesity complicating pregnancy, third trimester: Secondary | ICD-10-CM | POA: Diagnosis not present

## 2020-09-08 DIAGNOSIS — Z794 Long term (current) use of insulin: Secondary | ICD-10-CM

## 2020-09-08 DIAGNOSIS — O3413 Maternal care for benign tumor of corpus uteri, third trimester: Secondary | ICD-10-CM

## 2020-09-15 ENCOUNTER — Other Ambulatory Visit (HOSPITAL_COMMUNITY)
Admission: RE | Admit: 2020-09-15 | Discharge: 2020-09-15 | Disposition: A | Discharge status: Home / Self Care | Payer: 59 | Source: Ambulatory Visit | Attending: Obstetrics and Gynecology | Admitting: Obstetrics and Gynecology

## 2020-09-15 ENCOUNTER — Ambulatory Visit: Payer: 59 | Admitting: *Deleted

## 2020-09-15 ENCOUNTER — Encounter: Payer: Self-pay | Admitting: *Deleted

## 2020-09-15 ENCOUNTER — Other Ambulatory Visit: Payer: Self-pay | Admitting: Obstetrics and Gynecology

## 2020-09-15 ENCOUNTER — Encounter: Payer: Self-pay | Admitting: Obstetrics and Gynecology

## 2020-09-15 ENCOUNTER — Ambulatory Visit (HOSPITAL_BASED_OUTPATIENT_CLINIC_OR_DEPARTMENT_OTHER): Payer: 59

## 2020-09-15 ENCOUNTER — Ambulatory Visit (INDEPENDENT_AMBULATORY_CARE_PROVIDER_SITE_OTHER): Payer: 59 | Admitting: Obstetrics and Gynecology

## 2020-09-15 ENCOUNTER — Other Ambulatory Visit: Payer: Self-pay

## 2020-09-15 VITALS — BP 114/79 | HR 96 | Wt 189.0 lb

## 2020-09-15 DIAGNOSIS — O09523 Supervision of elderly multigravida, third trimester: Secondary | ICD-10-CM

## 2020-09-15 DIAGNOSIS — O24913 Unspecified diabetes mellitus in pregnancy, third trimester: Secondary | ICD-10-CM

## 2020-09-15 DIAGNOSIS — O09293 Supervision of pregnancy with other poor reproductive or obstetric history, third trimester: Secondary | ICD-10-CM

## 2020-09-15 DIAGNOSIS — R030 Elevated blood-pressure reading, without diagnosis of hypertension: Secondary | ICD-10-CM | POA: Insufficient documentation

## 2020-09-15 DIAGNOSIS — O99210 Obesity complicating pregnancy, unspecified trimester: Secondary | ICD-10-CM | POA: Insufficient documentation

## 2020-09-15 DIAGNOSIS — Z8759 Personal history of other complications of pregnancy, childbirth and the puerperium: Secondary | ICD-10-CM

## 2020-09-15 DIAGNOSIS — Z148 Genetic carrier of other disease: Secondary | ICD-10-CM

## 2020-09-15 DIAGNOSIS — Z3480 Encounter for supervision of other normal pregnancy, unspecified trimester: Secondary | ICD-10-CM

## 2020-09-15 DIAGNOSIS — D259 Leiomyoma of uterus, unspecified: Secondary | ICD-10-CM

## 2020-09-15 DIAGNOSIS — O99213 Obesity complicating pregnancy, third trimester: Secondary | ICD-10-CM

## 2020-09-15 DIAGNOSIS — O0993 Supervision of high risk pregnancy, unspecified, third trimester: Secondary | ICD-10-CM | POA: Diagnosis present

## 2020-09-15 DIAGNOSIS — O3413 Maternal care for benign tumor of corpus uteri, third trimester: Secondary | ICD-10-CM

## 2020-09-15 DIAGNOSIS — O24414 Gestational diabetes mellitus in pregnancy, insulin controlled: Secondary | ICD-10-CM

## 2020-09-15 DIAGNOSIS — Z362 Encounter for other antenatal screening follow-up: Secondary | ICD-10-CM

## 2020-09-15 DIAGNOSIS — E669 Obesity, unspecified: Secondary | ICD-10-CM

## 2020-09-15 DIAGNOSIS — Z3A36 36 weeks gestation of pregnancy: Secondary | ICD-10-CM

## 2020-09-15 DIAGNOSIS — Z794 Long term (current) use of insulin: Secondary | ICD-10-CM

## 2020-09-15 MED ORDER — PANTOPRAZOLE SODIUM 40 MG PO TBEC: 40.0000 mg | DELAYED_RELEASE_TABLET | Freq: Every day | ORAL | 1 refills | Status: DC

## 2020-09-15 MED ORDER — METOCLOPRAMIDE HCL 5 MG PO TABS: 5.0000 mg | ORAL_TABLET | Freq: Three times a day (TID) | ORAL | 2 refills | Status: DC

## 2020-09-15 NOTE — Progress Notes (Signed)
   PRENATAL VISIT NOTE  Subjective:  Diane Snyder is a 39 y.o. G3P2002 at [redacted]w[redacted]d being seen today for ongoing prenatal care.  She is currently monitored for the following issues for this high-risk pregnancy and has Chest pain; Supervision of high-risk pregnancy; AMA (advanced maternal age) multigravida 71+; Maternal obesity affecting pregnancy, antepartum; History of gestational hypertension; Diabetes mellitus affecting pregnancy; and Borderline hypertension on their problem list.  Patient reports nausea and vomiting.  Contractions: Irritability. Vag. Bleeding: None.  Movement: Present. Denies leaking of fluid.   The following portions of the patient's history were reviewed and updated as appropriate: allergies, current medications, past family history, past medical history, past social history, past surgical history and problem list.   Objective:   Vitals:   09/15/20 0955  BP: 114/79  Pulse: 96  Weight: 189 lb (85.7 kg)    Fetal Status:     Movement: Present     General:  Alert, oriented and cooperative. Patient is in no acute distress.  Skin: Skin is warm and dry. No rash noted.   Cardiovascular: Normal heart rate noted  Respiratory: Normal respiratory effort, no problems with respiration noted  Abdomen: Soft, gravid, appropriate for gestational age.  Pain/Pressure: Present     Pelvic: Cervical exam performed in the presence of a chaperone        Extremities: Normal range of motion.  Edema: None  Mental Status: Normal mood and affect. Normal behavior. Normal judgment and thought content.   Assessment and Plan:  Pregnancy: G3P2002 at [redacted]w[redacted]d 1. Supervision of high risk pregnancy in third trimester Patient is doing well, reporting nausea/emesis following meals. Patient never took previously prescribed reglan and protonix Rx protonix provided Cultures today  2. Diabetes mellitus affecting pregnancy in third trimester CBGs reviewed and great majority within range No change in  insulin dosing Normal BPP last week Continue antenatal testing per MFM  3. Multigravida of advanced maternal age in third trimester   4. Maternal obesity affecting pregnancy, antepartum   Preterm labor symptoms and general obstetric precautions including but not limited to vaginal bleeding, contractions, leaking of fluid and fetal movement were reviewed in detail with the patient. Please refer to After Visit Summary for other counseling recommendations.   Return in about 1 week (around 09/22/2020) for in person, ROB, High risk.  Future Appointments  Date Time Provider [place]  09/15/2020 11:00 AM [facility] NURSE [facility]  09/15/2020 11:15 AM [facility] US2 [facility]  09/22/2020  9:30 AM [facility] NURSE [facility]  09/22/2020  9:45 AM [facility] US5 [facility] [place]    Mora Bellman, MD

## 2020-09-15 NOTE — Progress Notes (Signed)
ROB/GBS.  C/o NV and pressure.

## 2020-09-16 LAB — CERVICOVAGINAL ANCILLARY ONLY
Chlamydia: NEGATIVE
Comment: NEGATIVE
Comment: NORMAL
Neisseria Gonorrhea: NEGATIVE

## 2020-09-18 LAB — STREP GP B NAA: Strep Gp B NAA: NEGATIVE

## 2020-09-21 ENCOUNTER — Other Ambulatory Visit: Payer: Self-pay | Admitting: Advanced Practice Midwife

## 2020-09-21 ENCOUNTER — Ambulatory Visit (INDEPENDENT_AMBULATORY_CARE_PROVIDER_SITE_OTHER): Payer: 59 | Admitting: Obstetrics and Gynecology

## 2020-09-21 ENCOUNTER — Encounter: Payer: Self-pay | Admitting: Obstetrics and Gynecology

## 2020-09-21 ENCOUNTER — Other Ambulatory Visit: Payer: Self-pay

## 2020-09-21 VITALS — BP 130/84 | HR 93 | Wt 191.8 lb

## 2020-09-21 DIAGNOSIS — Z8759 Personal history of other complications of pregnancy, childbirth and the puerperium: Secondary | ICD-10-CM

## 2020-09-21 DIAGNOSIS — R030 Elevated blood-pressure reading, without diagnosis of hypertension: Secondary | ICD-10-CM

## 2020-09-21 DIAGNOSIS — O99210 Obesity complicating pregnancy, unspecified trimester: Secondary | ICD-10-CM

## 2020-09-21 DIAGNOSIS — O0993 Supervision of high risk pregnancy, unspecified, third trimester: Secondary | ICD-10-CM

## 2020-09-21 DIAGNOSIS — O24913 Unspecified diabetes mellitus in pregnancy, third trimester: Secondary | ICD-10-CM

## 2020-09-21 DIAGNOSIS — O09529 Supervision of elderly multigravida, unspecified trimester: Secondary | ICD-10-CM

## 2020-09-21 DIAGNOSIS — Z3A37 37 weeks gestation of pregnancy: Secondary | ICD-10-CM

## 2020-09-21 MED ORDER — INSULIN NPH (HUMAN) (ISOPHANE) 100 UNIT/ML ~~LOC~~ SUSP: 20.0000 [IU] | Freq: Every day | SUBCUTANEOUS | 11 refills | Status: DC

## 2020-09-21 NOTE — Progress Notes (Signed)
Pt is here for ROB, [redacted]w[redacted]d.

## 2020-09-21 NOTE — Patient Instructions (Addendum)
Increase your insulin to 20 units nightly. Please continue all other medications as prescribed.  Third Trimester of Pregnancy The third trimester is from week 28 through week 40 (months 7 through 9). The third trimester is a time when the unborn baby (fetus) is growing rapidly. At the end of the ninth month, the fetus is about 20 inches in length and weighs 6-10 pounds. Body changes during your third trimester Your body will continue to go through many changes during pregnancy. The changes vary from woman to woman. During the third trimester:  Your weight will continue to increase. You can expect to gain 25-35 pounds (11-16 kg) by the end of the pregnancy.  You may begin to get stretch marks on your hips, abdomen, and breasts.  You may urinate more often because the fetus is moving lower into your pelvis and pressing on your bladder.  You may develop or continue to have heartburn. This is caused by increased hormones that slow down muscles in the digestive tract.  You may develop or continue to have constipation because increased hormones slow digestion and cause the muscles that push waste through your intestines to relax.  You may develop hemorrhoids. These are swollen veins (varicose veins) in the rectum that can itch or be painful.  You may develop swollen, bulging veins (varicose veins) in your legs.  You may have increased body aches in the pelvis, back, or thighs. This is due to weight gain and increased hormones that are relaxing your joints.  You may have changes in your hair. These can include thickening of your hair, rapid growth, and changes in texture. Some women also have hair loss during or after pregnancy, or hair that feels dry or thin. Your hair will most likely return to normal after your baby is born.  Your breasts will continue to grow and they will continue to become tender. A yellow fluid (colostrum) may leak from your breasts. This is the first milk you are producing  for your baby.  Your belly button may stick out.  You may notice more swelling in your hands, face, or ankles.  You may have increased tingling or numbness in your hands, arms, and legs. The skin on your belly may also feel numb.  You may feel short of breath because of your expanding uterus.  You may have more problems sleeping. This can be caused by the size of your belly, increased need to urinate, and an increase in your body's metabolism.  You may notice the fetus "dropping," or moving lower in your abdomen (lightening).  You may have increased vaginal discharge.  You may notice your joints feel loose and you may have pain around your pelvic bone. What to expect at prenatal visits You will have prenatal exams every 2 weeks until week 36. Then you will have weekly prenatal exams. During a routine prenatal visit:  You will be weighed to make sure you and the baby are growing normally.  Your blood pressure will be taken.  Your abdomen will be measured to track your baby's growth.  The fetal heartbeat will be listened to.  Any test results from the previous visit will be discussed.  You may have a cervical check near your due date to see if your cervix has softened or thinned (effaced).  You will be tested for Group B streptococcus. This happens between 35 and 37 weeks. Your health care provider may ask you:  What your birth plan is.  How you are feeling.  If you are feeling the baby move.  If you have had any abnormal symptoms, such as leaking fluid, bleeding, severe headaches, or abdominal cramping.  If you are using any tobacco products, including cigarettes, chewing tobacco, and electronic cigarettes.  If you have any questions. Other tests or screenings that may be performed during your third trimester include:  Blood tests that check for low iron levels (anemia).  Fetal testing to check the health, activity level, and growth of the fetus. Testing is done if  you have certain medical conditions or if there are problems during the pregnancy.  Nonstress test (NST). This test checks the health of your baby to make sure there are no signs of problems, such as the baby not getting enough oxygen. During this test, a belt is placed around your belly. The baby is made to move, and its heart rate is monitored during movement. What is false labor? False labor is a condition in which you feel small, irregular tightenings of the muscles in the womb (contractions) that usually go away with rest, changing position, or drinking water. These are called Braxton Hicks contractions. Contractions may last for hours, days, or even weeks before true labor sets in. If contractions come at regular intervals, become more frequent, increase in intensity, or become painful, you should see your health care provider. What are the signs of labor?  Abdominal cramps.  Regular contractions that start at 10 minutes apart and become stronger and more frequent with time.  Contractions that start on the top of the uterus and spread down to the lower abdomen and back.  Increased pelvic pressure and dull back pain.  A watery or bloody mucus discharge that comes from the vagina.  Leaking of amniotic fluid. This is also known as your "water breaking." It could be a slow trickle or a gush. Let your health care provider know if it has a color or strange odor. If you have any of these signs, call your health care provider right away, even if it is before your due date. Follow these instructions at home: Medicines  Follow your health care provider's instructions regarding medicine use. Specific medicines may be either safe or unsafe to take during pregnancy.  Take a prenatal vitamin that contains at least 600 micrograms (mcg) of folic acid.  If you develop constipation, try taking a stool softener if your health care provider approves. Eating and drinking   Eat a balanced diet that  includes fresh fruits and vegetables, whole grains, good sources of protein such as meat, eggs, or tofu, and low-fat dairy. Your health care provider will help you determine the amount of weight gain that is right for you.  Avoid raw meat and uncooked cheese. These carry germs that can cause birth defects in the baby.  If you have low calcium intake from food, talk to your health care provider about whether you should take a daily calcium supplement.  Eat four or five small meals rather than three large meals a day.  Limit foods that are high in fat and processed sugars, such as fried and sweet foods.  To prevent constipation: ? Drink enough fluid to keep your urine clear or pale yellow. ? Eat foods that are high in fiber, such as fresh fruits and vegetables, whole grains, and beans. Activity  Exercise only as directed by your health care provider. Most women can continue their usual exercise routine during pregnancy. Try to exercise for 30 minutes at least 5 days a  week. Stop exercising if you experience uterine contractions.  Avoid heavy lifting.  Do not exercise in extreme heat or humidity, or at high altitudes.  Wear low-heel, comfortable shoes.  Practice good posture.  You may continue to have sex unless your health care provider tells you otherwise. Relieving pain and discomfort  Take frequent breaks and rest with your legs elevated if you have leg cramps or low back pain.  Take warm sitz baths to soothe any pain or discomfort caused by hemorrhoids. Use hemorrhoid cream if your health care provider approves.  Wear a good support bra to prevent discomfort from breast tenderness.  If you develop varicose veins: ? Wear support pantyhose or compression stockings as told by your healthcare provider. ? Elevate your feet for 15 minutes, 3-4 times a day. Prenatal care  Write down your questions. Take them to your prenatal visits.  Keep all your prenatal visits as told by your  health care provider. This is important. Safety  Wear your seat belt at all times when driving.  Make a list of emergency phone numbers, including numbers for family, friends, the hospital, and police and fire departments. General instructions  Avoid cat litter boxes and soil used by cats. These carry germs that can cause birth defects in the baby. If you have a cat, ask someone to clean the litter box for you.  Do not travel far distances unless it is absolutely necessary and only with the approval of your health care provider.  Do not use hot tubs, steam rooms, or saunas.  Do not drink alcohol.  Do not use any products that contain nicotine or tobacco, such as cigarettes and e-cigarettes. If you need help quitting, ask your health care provider.  Do not use any medicinal herbs or unprescribed drugs. These chemicals affect the formation and growth of the baby.  Do not douche or use tampons or scented sanitary pads.  Do not cross your legs for long periods of time.  To prepare for the arrival of your baby: ? Take prenatal classes to understand, practice, and ask questions about labor and delivery. ? Make a trial run to the hospital. ? Visit the hospital and tour the maternity area. ? Arrange for maternity or paternity leave through employers. ? Arrange for family and friends to take care of pets while you are in the hospital. ? Purchase a rear-facing car seat and make sure you know how to install it in your car. ? Pack your hospital bag. ? Prepare the babys nursery. Make sure to remove all pillows and stuffed animals from the baby's crib to prevent suffocation.  Visit your dentist if you have not gone during your pregnancy. Use a soft toothbrush to brush your teeth and be gentle when you floss. Contact a health care provider if:  You are unsure if you are in labor or if your water has broken.  You become dizzy.  You have mild pelvic cramps, pelvic pressure, or nagging pain  in your abdominal area.  You have lower back pain.  You have persistent nausea, vomiting, or diarrhea.  You have an unusual or bad smelling vaginal discharge.  You have pain when you urinate. Get help right away if:  Your water breaks before 37 weeks.  You have regular contractions less than 5 minutes apart before 37 weeks.  You have a fever.  You are leaking fluid from your vagina.  You have spotting or bleeding from your vagina.  You have severe abdominal pain  or cramping.  You have rapid weight loss or weight gain.  You have shortness of breath with chest pain.  You notice sudden or extreme swelling of your face, hands, ankles, feet, or legs.  Your baby makes fewer than 10 movements in 2 hours.  You have severe headaches that do not go away when you take medicine.  You have vision changes. Summary  The third trimester is from week 28 through week 40, months 7 through 9. The third trimester is a time when the unborn baby (fetus) is growing rapidly.  During the third trimester, your discomfort may increase as you and your baby continue to gain weight. You may have abdominal, leg, and back pain, sleeping problems, and an increased need to urinate.  During the third trimester your breasts will keep growing and they will continue to become tender. A yellow fluid (colostrum) may leak from your breasts. This is the first milk you are producing for your baby.  False labor is a condition in which you feel small, irregular tightenings of the muscles in the womb (contractions) that eventually go away. These are called Braxton Hicks contractions. Contractions may last for hours, days, or even weeks before true labor sets in.  Signs of labor can include: abdominal cramps; regular contractions that start at 10 minutes apart and become stronger and more frequent with time; watery or bloody mucus discharge that comes from the vagina; increased pelvic pressure and dull back pain; and  leaking of amniotic fluid. This information is not intended to replace advice given to you by your health care provider. Make sure you discuss any questions you have with your health care provider. Document Revised: 03/05/2019 Document Reviewed: 12/18/2016 Elsevier Patient Education  [place].

## 2020-09-21 NOTE — Progress Notes (Signed)
PRENATAL VISIT NOTE  Subjective:  Diane Snyder is a 39 y.o. G3P2002 at [redacted]w[redacted]d being seen today for ongoing prenatal care.  She is currently monitored for the following issues for this high-risk pregnancy and has Chest pain; Supervision of high-risk pregnancy; AMA (advanced maternal age) multigravida 19+; Maternal obesity affecting pregnancy, antepartum; History of gestational hypertension; Diabetes mellitus affecting pregnancy; Borderline hypertension; and [redacted] weeks gestation of pregnancy on their problem list.  Patient reports no complaints.  Contractions: Not present. Vag. Bleeding: None.  Movement: Present. Denies leaking of fluid. Pt reports good adherence with insulin qHS. Pt reports fasting BG levels slightly elevated over the last several days: 84, 88, 94, 94, 100, 102, 106. Pt reports 2hr post-prandial BG levels: 91, 143, 116, 107, 99. Has BP cuff at home but not currently working. No concern of HA or vision changes. Still desires postpartum BTL.  The following portions of the patient's history were reviewed and updated as appropriate: allergies, current medications, past family history, past medical history, past social history, past surgical history and problem list.   Objective:   Vitals:   09/21/20 1531  BP: 130/84  Pulse: 93  Weight: 191 lb 12.8 oz (87 kg)    Fetal Status: Fetal Heart Rate (bpm): 140   Movement: Present     General:  Alert, oriented and cooperative. Patient is in no acute distress.  Skin: Skin is warm and dry. No rash noted.   Cardiovascular: Normal heart rate noted  Respiratory: Normal respiratory effort, no problems with respiration noted  Abdomen: Soft, gravid, appropriate for gestational age.  Pain/Pressure: Present     Pelvic: Cervical exam deferred        Extremities: Normal range of motion.  Edema: None  Mental Status: Normal mood and affect. Normal behavior. Normal judgment and thought content.   Assessment and Plan:  Pregnancy: G3P2002 at  [redacted]w[redacted]d 1. Supervision of high risk pregnancy in third trimester, [redacted] weeks gestation of pregnancy: No reported concerns and no red flag symptoms. Pt endorses +fetal movement and +FHTs today. Confirmed vertex on Leopold's. GBS negative. - s/p flu and Tdap vaccines - continue prenatal vitamin + baby ASA - rtc in 1 week for f/u prenatal appt (plan for IOL @39wk  for well-controlled A2GDM as noted below)  2. History of gestational hypertension: Blood pressure wnl today. No elevated blood pressures previously in this current pregnancy. Pt not checking BP at home. No concerning symptoms.  - counseled on preeclampsia signs/symptoms today - continue daily baby ASA  3. Maternal obesity affecting pregnancy, antepartum - counseled on continued physical activity and healthy meal/snack options - continue daily baby ASA  4. Antepartum multigravida of advanced maternal age: low risk genetic screen.  5. Diabetes mellitus affecting pregnancy in third trimester: Currently well controlled with good adherence to NPH 19 units qHS. Pt does report fasting BG levels slightly above goal for the past several days but nearly all 2hr postprandial levels at goal. Recent growth ultrasound @[redacted]w[redacted]d  showed baby grown to 2531g at 46% with BPP 8/8 on 09/15/20. - increased NPH to 20 units qHS to improve BG control in AM - continue daily ASA as noted above - plan for repeat BPP on 10/28 - scheduled IOL @[redacted]w[redacted]d  today per MFM  Term labor symptoms and general obstetric precautions including but not limited to vaginal bleeding, contractions, leaking of fluid and fetal movement were reviewed in detail with the patient. Please refer to After Visit Summary for other counseling recommendations.   Return in  about 1 week (around 09/28/2020) for in person f/u prenatal appt, MD provider.  Future Appointments  Date Time Provider [place]  09/22/2020  9:30 AM [facility] NURSE [facility]  09/22/2020  9:45 AM [facility] US5 [facility]    09/29/2020  9:15 AM Griffin Basil, MD [place] None  10/01/2020  7:55 AM [facility] SCHED ROOM [facility] None    Randa Ngo, MD OB Fellow, [facility] 09/21/2020 5:21 PM

## 2020-09-22 ENCOUNTER — Encounter: Payer: 59 | Admitting: Obstetrics and Gynecology

## 2020-09-22 ENCOUNTER — Ambulatory Visit: Payer: 59 | Attending: Obstetrics and Gynecology

## 2020-09-22 ENCOUNTER — Encounter: Payer: Self-pay | Admitting: *Deleted

## 2020-09-22 ENCOUNTER — Other Ambulatory Visit: Payer: Self-pay | Admitting: Obstetrics and Gynecology

## 2020-09-22 ENCOUNTER — Ambulatory Visit: Payer: 59 | Admitting: *Deleted

## 2020-09-22 ENCOUNTER — Other Ambulatory Visit: Payer: Self-pay | Admitting: *Deleted

## 2020-09-22 DIAGNOSIS — O24414 Gestational diabetes mellitus in pregnancy, insulin controlled: Secondary | ICD-10-CM

## 2020-09-22 DIAGNOSIS — O09293 Supervision of pregnancy with other poor reproductive or obstetric history, third trimester: Secondary | ICD-10-CM

## 2020-09-22 DIAGNOSIS — O24419 Gestational diabetes mellitus in pregnancy, unspecified control: Secondary | ICD-10-CM

## 2020-09-22 DIAGNOSIS — R030 Elevated blood-pressure reading, without diagnosis of hypertension: Secondary | ICD-10-CM

## 2020-09-22 DIAGNOSIS — D259 Leiomyoma of uterus, unspecified: Secondary | ICD-10-CM

## 2020-09-22 DIAGNOSIS — O3413 Maternal care for benign tumor of corpus uteri, third trimester: Secondary | ICD-10-CM

## 2020-09-22 DIAGNOSIS — Z148 Genetic carrier of other disease: Secondary | ICD-10-CM

## 2020-09-22 DIAGNOSIS — O99210 Obesity complicating pregnancy, unspecified trimester: Secondary | ICD-10-CM

## 2020-09-22 DIAGNOSIS — O0993 Supervision of high risk pregnancy, unspecified, third trimester: Secondary | ICD-10-CM

## 2020-09-22 DIAGNOSIS — O09523 Supervision of elderly multigravida, third trimester: Secondary | ICD-10-CM

## 2020-09-22 DIAGNOSIS — Z362 Encounter for other antenatal screening follow-up: Secondary | ICD-10-CM

## 2020-09-22 DIAGNOSIS — Z8759 Personal history of other complications of pregnancy, childbirth and the puerperium: Secondary | ICD-10-CM | POA: Insufficient documentation

## 2020-09-22 DIAGNOSIS — Z3A37 37 weeks gestation of pregnancy: Secondary | ICD-10-CM

## 2020-09-22 NOTE — Progress Notes (Signed)
Orders for IOL secondary to A2GDM on insulin.

## 2020-09-23 ENCOUNTER — Other Ambulatory Visit: Payer: Self-pay | Admitting: Advanced Practice Midwife

## 2020-09-26 ENCOUNTER — Telehealth (HOSPITAL_COMMUNITY): Payer: Self-pay | Admitting: *Deleted

## 2020-09-26 NOTE — Telephone Encounter (Signed)
Preadmission screen  

## 2020-09-27 ENCOUNTER — Telehealth (HOSPITAL_COMMUNITY): Payer: Self-pay | Admitting: *Deleted

## 2020-09-27 NOTE — Telephone Encounter (Signed)
Preadmission screen  

## 2020-09-28 ENCOUNTER — Telehealth (HOSPITAL_COMMUNITY): Payer: Self-pay | Admitting: *Deleted

## 2020-09-28 ENCOUNTER — Encounter (HOSPITAL_COMMUNITY): Payer: Self-pay | Admitting: *Deleted

## 2020-09-28 NOTE — Telephone Encounter (Signed)
Preadmission screen  

## 2020-09-29 ENCOUNTER — Other Ambulatory Visit: Payer: Self-pay | Admitting: Obstetrics

## 2020-09-29 ENCOUNTER — Ambulatory Visit (INDEPENDENT_AMBULATORY_CARE_PROVIDER_SITE_OTHER): Payer: 59 | Admitting: Obstetrics and Gynecology

## 2020-09-29 ENCOUNTER — Ambulatory Visit (HOSPITAL_BASED_OUTPATIENT_CLINIC_OR_DEPARTMENT_OTHER): Payer: 59

## 2020-09-29 ENCOUNTER — Other Ambulatory Visit: Payer: Self-pay

## 2020-09-29 ENCOUNTER — Other Ambulatory Visit (HOSPITAL_COMMUNITY)
Admission: RE | Admit: 2020-09-29 | Discharge: 2020-09-29 | Disposition: A | Discharge status: Home / Self Care | Payer: 59 | Source: Ambulatory Visit | Attending: Family Medicine | Admitting: Family Medicine

## 2020-09-29 ENCOUNTER — Encounter: Payer: Self-pay | Admitting: *Deleted

## 2020-09-29 ENCOUNTER — Ambulatory Visit: Payer: 59 | Admitting: *Deleted

## 2020-09-29 VITALS — BP 135/86 | HR 87 | Wt 191.0 lb

## 2020-09-29 DIAGNOSIS — O24913 Unspecified diabetes mellitus in pregnancy, third trimester: Secondary | ICD-10-CM

## 2020-09-29 DIAGNOSIS — O09523 Supervision of elderly multigravida, third trimester: Secondary | ICD-10-CM

## 2020-09-29 DIAGNOSIS — O24414 Gestational diabetes mellitus in pregnancy, insulin controlled: Secondary | ICD-10-CM | POA: Diagnosis not present

## 2020-09-29 DIAGNOSIS — Z3A38 38 weeks gestation of pregnancy: Secondary | ICD-10-CM

## 2020-09-29 DIAGNOSIS — O09293 Supervision of pregnancy with other poor reproductive or obstetric history, third trimester: Secondary | ICD-10-CM

## 2020-09-29 DIAGNOSIS — R030 Elevated blood-pressure reading, without diagnosis of hypertension: Secondary | ICD-10-CM

## 2020-09-29 DIAGNOSIS — D259 Leiomyoma of uterus, unspecified: Secondary | ICD-10-CM

## 2020-09-29 DIAGNOSIS — O99210 Obesity complicating pregnancy, unspecified trimester: Secondary | ICD-10-CM

## 2020-09-29 DIAGNOSIS — O99213 Obesity complicating pregnancy, third trimester: Secondary | ICD-10-CM | POA: Diagnosis not present

## 2020-09-29 DIAGNOSIS — O3413 Maternal care for benign tumor of corpus uteri, third trimester: Secondary | ICD-10-CM

## 2020-09-29 DIAGNOSIS — Z8759 Personal history of other complications of pregnancy, childbirth and the puerperium: Secondary | ICD-10-CM

## 2020-09-29 DIAGNOSIS — Z148 Genetic carrier of other disease: Secondary | ICD-10-CM

## 2020-09-29 DIAGNOSIS — Z362 Encounter for other antenatal screening follow-up: Secondary | ICD-10-CM

## 2020-09-29 DIAGNOSIS — O0993 Supervision of high risk pregnancy, unspecified, third trimester: Secondary | ICD-10-CM

## 2020-09-29 DIAGNOSIS — O24419 Gestational diabetes mellitus in pregnancy, unspecified control: Secondary | ICD-10-CM

## 2020-09-29 LAB — SARS CORONAVIRUS 2 (TAT 6-24 HRS): SARS Coronavirus 2: NEGATIVE

## 2020-09-29 NOTE — Patient Instructions (Signed)

## 2020-09-29 NOTE — Progress Notes (Signed)
   PRENATAL VISIT NOTE  Subjective:  Diane Snyder is a 39 y.o. G3P2002 at [redacted]w[redacted]d being seen today for ongoing prenatal care.  She is currently monitored for the following issues for this high-risk pregnancy and has Chest pain; Supervision of high-risk pregnancy; AMA (advanced maternal age) multigravida 60+; Maternal obesity affecting pregnancy, antepartum; History of gestational hypertension; Diabetes mellitus affecting pregnancy; Borderline hypertension; [redacted] weeks gestation of pregnancy; and [redacted] weeks gestation of pregnancy on their problem list.  Patient doing well with no acute concerns today. She reports no complaints.  Contractions: Irregular. Vag. Bleeding: None.  Movement: Present. Denies leaking of fluid.   Pt seen.  She just completed her final BPP and growth scan.  Results are not in the chart yet, but per pt weight was 7 lbs 15 ounces , ?80%  8/8 BPP  FBS: 99-104 PPBS: 88-47  Pt notes adherence to treatment regimen and diet, will hold changes since pt is induced in 2 days  The following portions of the patient's history were reviewed and updated as appropriate: allergies, current medications, past family history, past medical history, past social history, past surgical history and problem list. Problem list updated.  Objective:   Vitals:   09/29/20 0920  BP: 135/86  Pulse: 87  Weight: 191 lb (86.6 kg)    Fetal Status: Fetal Heart Rate (bpm): 146   Movement: Present     General:  Alert, oriented and cooperative. Patient is in no acute distress.  Skin: Skin is warm and dry. No rash noted.   Cardiovascular: Normal heart rate noted  Respiratory: Normal respiratory effort, no problems with respiration noted  Abdomen: Soft, gravid, appropriate for gestational age.  Pain/Pressure: Present     Pelvic: Cervical exam deferred        Extremities: Normal range of motion.  Edema: None  Mental Status:  Normal mood and affect. Normal behavior. Normal judgment and thought content.    Assessment and Plan:  Pregnancy: G3P2002 at [redacted]w[redacted]d  1. Supervision of high risk pregnancy in third trimester IOL at 39 weeks  2. Diabetes mellitus affecting pregnancy in third trimester Blood sugar control not optimal, but majority of postprandials were in range.  Fastings have been worsening.  3. Maternal obesity affecting pregnancy, antepartum   4. History of gestational hypertension   5. Borderline hypertension BP borderline at 135/86, no s/sx of PIH  6. [redacted] weeks gestation of pregnancy   Term labor symptoms and general obstetric precautions including but not limited to vaginal bleeding, contractions, leaking of fluid and fetal movement were reviewed in detail with the patient.  Please refer to After Visit Summary for other counseling recommendations.   No follow-ups on file.   Lynnda Shields, MD

## 2020-09-29 NOTE — Progress Notes (Signed)
Patient reports fetal movement with irregular contractions.

## 2020-09-30 ENCOUNTER — Other Ambulatory Visit: Payer: Self-pay | Admitting: Obstetrics and Gynecology

## 2020-09-30 NOTE — Progress Notes (Signed)
IOL orders entered 

## 2020-10-01 ENCOUNTER — Inpatient Hospital Stay (HOSPITAL_COMMUNITY)
Admission: AD | Admit: 2020-10-01 | Discharge: 2020-10-04 | DRG: 806 | Disposition: A | Discharge status: Home / Self Care | Payer: 59 | Attending: Obstetrics & Gynecology | Admitting: Obstetrics & Gynecology

## 2020-10-01 ENCOUNTER — Inpatient Hospital Stay (HOSPITAL_COMMUNITY): Payer: 59 | Admitting: Anesthesiology

## 2020-10-01 ENCOUNTER — Inpatient Hospital Stay (HOSPITAL_COMMUNITY): Payer: 59 | Attending: Obstetrics & Gynecology

## 2020-10-01 ENCOUNTER — Encounter (HOSPITAL_COMMUNITY): Payer: Self-pay | Admitting: Obstetrics and Gynecology

## 2020-10-01 ENCOUNTER — Other Ambulatory Visit: Payer: Self-pay

## 2020-10-01 DIAGNOSIS — O24414 Gestational diabetes mellitus in pregnancy, insulin controlled: Secondary | ICD-10-CM

## 2020-10-01 DIAGNOSIS — O26893 Other specified pregnancy related conditions, third trimester: Secondary | ICD-10-CM

## 2020-10-01 DIAGNOSIS — Z8759 Personal history of other complications of pregnancy, childbirth and the puerperium: Secondary | ICD-10-CM

## 2020-10-01 DIAGNOSIS — Z87891 Personal history of nicotine dependence: Secondary | ICD-10-CM

## 2020-10-01 DIAGNOSIS — O24424 Gestational diabetes mellitus in childbirth, insulin controlled: Secondary | ICD-10-CM | POA: Diagnosis present

## 2020-10-01 DIAGNOSIS — R030 Elevated blood-pressure reading, without diagnosis of hypertension: Secondary | ICD-10-CM | POA: Diagnosis present

## 2020-10-01 DIAGNOSIS — O0990 Supervision of high risk pregnancy, unspecified, unspecified trimester: Secondary | ICD-10-CM

## 2020-10-01 DIAGNOSIS — O09523 Supervision of elderly multigravida, third trimester: Secondary | ICD-10-CM

## 2020-10-01 DIAGNOSIS — Z3A39 39 weeks gestation of pregnancy: Secondary | ICD-10-CM

## 2020-10-01 DIAGNOSIS — O2243 Hemorrhoids in pregnancy, third trimester: Secondary | ICD-10-CM | POA: Diagnosis present

## 2020-10-01 DIAGNOSIS — Z20822 Contact with and (suspected) exposure to covid-19: Secondary | ICD-10-CM | POA: Diagnosis present

## 2020-10-01 DIAGNOSIS — R079 Chest pain, unspecified: Secondary | ICD-10-CM

## 2020-10-01 DIAGNOSIS — E669 Obesity, unspecified: Secondary | ICD-10-CM | POA: Diagnosis present

## 2020-10-01 DIAGNOSIS — O09529 Supervision of elderly multigravida, unspecified trimester: Secondary | ICD-10-CM

## 2020-10-01 DIAGNOSIS — O99213 Obesity complicating pregnancy, third trimester: Secondary | ICD-10-CM

## 2020-10-01 DIAGNOSIS — O99214 Obesity complicating childbirth: Secondary | ICD-10-CM | POA: Diagnosis present

## 2020-10-01 DIAGNOSIS — O99210 Obesity complicating pregnancy, unspecified trimester: Secondary | ICD-10-CM | POA: Diagnosis present

## 2020-10-01 DIAGNOSIS — O24919 Unspecified diabetes mellitus in pregnancy, unspecified trimester: Secondary | ICD-10-CM | POA: Diagnosis present

## 2020-10-01 LAB — COMPREHENSIVE METABOLIC PANEL
ALT: 13 U/L (ref 0–44)
AST: 27 U/L (ref 15–41)
Albumin: 3 g/dL — ABNORMAL LOW (ref 3.5–5.0)
Alkaline Phosphatase: 153 U/L — ABNORMAL HIGH (ref 38–126)
Anion gap: 13 (ref 5–15)
BUN: <5 mg/dL — ABNORMAL LOW (ref 6–20)
CO2: 20 mmol/L — ABNORMAL LOW (ref 22–32)
Calcium: 9.5 mg/dL (ref 8.9–10.3)
Chloride: 102 mmol/L (ref 98–111)
Creatinine, Ser: 0.68 mg/dL (ref 0.44–1.00)
GFR, Estimated: >60 mL/min (ref >60)
Glucose, Bld: 113 mg/dL — ABNORMAL HIGH (ref 70–99)
Potassium: 3.3 mmol/L — ABNORMAL LOW (ref 3.5–5.1)
Sodium: 135 mmol/L (ref 135–145)
Total Bilirubin: 0.8 mg/dL (ref 0.3–1.2)
Total Protein: 6.5 g/dL (ref 6.5–8.1)

## 2020-10-01 LAB — CBC
HCT: 36.4 % (ref 36.0–46.0)
Hemoglobin: 11.5 g/dL — ABNORMAL LOW (ref 12.0–15.0)
MCH: 26.9 pg (ref 26.0–34.0)
MCHC: 31.6 g/dL (ref 30.0–36.0)
MCV: 85 fL (ref 80.0–100.0)
Platelets: 215 10*3/uL (ref 150–400)
RBC: 4.28 MIL/uL (ref 3.87–5.11)
RDW: 17.5 % — ABNORMAL HIGH (ref 11.5–15.5)
WBC: 5.7 10*3/uL (ref 4.0–10.5)
nRBC: 0 % (ref 0.0–0.2)

## 2020-10-01 LAB — GLUCOSE, CAPILLARY
Glucose-Capillary: 78 mg/dL (ref 70–99)
Glucose-Capillary: 86 mg/dL (ref 70–99)
Glucose-Capillary: 90 mg/dL (ref 70–99)

## 2020-10-01 LAB — PROTEIN / CREATININE RATIO, URINE
Creatinine, Urine: 148.67 mg/dL
Protein Creatinine Ratio: 0.18 mg/mg{Cre} — ABNORMAL HIGH (ref 0.00–0.15)
Total Protein, Urine: 27 mg/dL

## 2020-10-01 LAB — TYPE AND SCREEN
ABO/RH(D): O POS
Antibody Screen: NEGATIVE

## 2020-10-01 MED ORDER — FENTANYL-BUPIVACAINE-NACL 0.5-0.125-0.9 MG/250ML-% EP SOLN: 12.0000 mL/h | EPIDURAL | Status: DC | PRN

## 2020-10-01 MED ORDER — ONDANSETRON HCL 4 MG/2ML IJ SOLN: 4.0000 mg | Freq: Four times a day (QID) | INTRAMUSCULAR | Status: DC | PRN

## 2020-10-01 MED ORDER — EPHEDRINE 5 MG/ML INJ: 10.0000 mg | INTRAVENOUS | Status: DC | PRN

## 2020-10-01 MED ORDER — LACTATED RINGERS IV SOLN: 500.0000 mL | Freq: Once | INTRAVENOUS | Status: DC

## 2020-10-01 MED ORDER — OXYTOCIN BOLUS FROM INFUSION
333.0000 mL | Freq: Once | INTRAVENOUS | Status: AC
  Administered 2020-10-02: 333 mL via INTRAVENOUS

## 2020-10-01 MED ORDER — FENTANYL CITRATE (PF) 100 MCG/2ML IJ SOLN
100.0000 ug | INTRAMUSCULAR | Status: DC | PRN
  Administered 2020-10-01: 100 ug via INTRAVENOUS
  Filled 2020-10-01: qty 2

## 2020-10-01 MED ORDER — ACETAMINOPHEN 325 MG PO TABS: 650.0000 mg | ORAL_TABLET | ORAL | Status: DC | PRN

## 2020-10-01 MED ORDER — OXYTOCIN-SODIUM CHLORIDE 30-0.9 UT/500ML-% IV SOLN
1.0000 m[IU]/min | INTRAVENOUS | Status: DC
  Administered 2020-10-01: 2 m[IU]/min via INTRAVENOUS
  Filled 2020-10-01: qty 500

## 2020-10-01 MED ORDER — DIPHENHYDRAMINE HCL 50 MG/ML IJ SOLN: 12.5000 mg | INTRAMUSCULAR | Status: DC | PRN

## 2020-10-01 MED ORDER — PHENYLEPHRINE 40 MCG/ML (10ML) SYRINGE FOR IV PUSH (FOR BLOOD PRESSURE SUPPORT): 80.0000 ug | PREFILLED_SYRINGE | INTRAVENOUS | Status: DC | PRN

## 2020-10-01 MED ORDER — LACTATED RINGERS IV SOLN: INTRAVENOUS | Status: DC

## 2020-10-01 MED ORDER — LACTATED RINGERS IV SOLN: 500.0000 mL | INTRAVENOUS | Status: DC | PRN

## 2020-10-01 MED ORDER — TERBUTALINE SULFATE 1 MG/ML IJ SOLN: 0.2500 mg | Freq: Once | INTRAMUSCULAR | Status: DC | PRN

## 2020-10-01 MED ORDER — LIDOCAINE HCL (PF) 1 % IJ SOLN
INTRAMUSCULAR | Status: DC | PRN
  Administered 2020-10-01 (×2): 4 mL via EPIDURAL

## 2020-10-01 MED ORDER — OXYCODONE-ACETAMINOPHEN 5-325 MG PO TABS: 1.0000 | ORAL_TABLET | ORAL | Status: DC | PRN

## 2020-10-01 MED ORDER — WITCH HAZEL-GLYCERIN EX PADS: MEDICATED_PAD | CUTANEOUS | Status: DC | PRN

## 2020-10-01 MED ORDER — HYDROCORTISONE (PERIANAL) 2.5 % EX CREA
TOPICAL_CREAM | Freq: Two times a day (BID) | CUTANEOUS | Status: DC | PRN
  Filled 2020-10-01: qty 28.35

## 2020-10-01 MED ORDER — OXYTOCIN-SODIUM CHLORIDE 30-0.9 UT/500ML-% IV SOLN
2.5000 [IU]/h | INTRAVENOUS | Status: DC
  Administered 2020-10-02: 2.5 [IU]/h via INTRAVENOUS

## 2020-10-01 MED ORDER — FENTANYL CITRATE (PF) 100 MCG/2ML IJ SOLN
INTRAMUSCULAR | Status: AC
  Administered 2020-10-01: 100 ug via INTRAVENOUS
  Filled 2020-10-01: qty 2

## 2020-10-01 MED ORDER — OXYCODONE-ACETAMINOPHEN 5-325 MG PO TABS: 2.0000 | ORAL_TABLET | ORAL | Status: DC | PRN

## 2020-10-01 MED ORDER — SOD CITRATE-CITRIC ACID 500-334 MG/5ML PO SOLN: 30.0000 mL | ORAL | Status: DC | PRN

## 2020-10-01 MED ORDER — PHENYLEPHRINE-MINERAL OIL-PET 0.25-14-74.9 % RE OINT
1.0000 "application " | TOPICAL_OINTMENT | Freq: Two times a day (BID) | RECTAL | Status: DC | PRN
  Filled 2020-10-01: qty 57

## 2020-10-01 MED ORDER — LIDOCAINE HCL (PF) 1 % IJ SOLN: 30.0000 mL | INTRAMUSCULAR | Status: DC | PRN

## 2020-10-01 MED ORDER — SODIUM CHLORIDE (PF) 0.9 % IJ SOLN
INTRAMUSCULAR | Status: DC | PRN
  Administered 2020-10-01: 12 mL/h via EPIDURAL

## 2020-10-01 MED ORDER — FENTANYL-BUPIVACAINE-NACL 0.5-0.125-0.9 MG/250ML-% EP SOLN
EPIDURAL | Status: AC
  Filled 2020-10-01: qty 250

## 2020-10-01 MED ORDER — LIDOCAINE-PRILOCAINE 2.5-2.5 % EX CREA
TOPICAL_CREAM | CUTANEOUS | Status: DC | PRN
  Filled 2020-10-01 (×2): qty 5

## 2020-10-01 NOTE — H&P (Addendum)
OBSTETRIC ADMISSION HISTORY AND PHYSICAL  Diane Snyder is a 39 y.o. female G61P2002 with IUP at 22w0dby 10w [place] I[place] 2/2 A2GDM. She reports +FMs, No LOF, no VB, no blurry vision, headaches or peripheral edema, and RUQ pain.  She plans on breast and bottle feeding. She request PP BTL for birth control. She [facility] at [facility] [place]--->  Estimated Date of Delivery: 10/08/20  Sono:    '@[redacted]w[redacted]d' , CWD, normal anatomy, cephalic presentation, posterior placenta, 266g, 51% EFW   Prenatal History/Complications:  Chest pain in pregnancy A2GDM Borderline hypertension AMA (advanced maternal age) multigravida 35+ Maternal obesity affecting pregnancy History of gestational hypertension  Past Medical History: Past Medical History:  Diagnosis Date  . Diverticulosis   . GDM (gestational diabetes mellitus) 03/2020  . Gestational diabetes   . History of gestational hypertension     Past Surgical History: Past Surgical History:  Procedure Laterality Date  . CHOLECYSTECTOMY      Obstetrical History: OB History    Gravida  3   Para  2   Term  2   Preterm      AB      Living  2     SAB      TAB      Ectopic      Multiple      Live Births  2           Social History Social History   Socioeconomic History  . Marital status: Single    Spouse name: Not on file  . Number of children: Not on file  . Years of education: Not on file  . Highest education level: Not on file  Occupational History  . Not on file  Tobacco Use  . Smoking status: Former SResearch scientist (life sciences) . Smokeless tobacco: Never Used  Vaping Use  . Vaping Use: Never used  Substance and Sexual Activity  . Alcohol use: Not Currently  . Drug use: No  . Sexual activity: Yes    Birth control/protection: None  Other Topics Concern  . Not on file  Social History Narrative  . Not on file   Social Determinants of Health   Financial Resource Strain:   . Difficulty  of Paying Living Expenses: Not on file  Food Insecurity:   . Worried About RCharity fundraiserin the Last Year: Not on file  . Ran Out of Food in the Last Year: Not on file  Transportation Needs:   . Lack of Transportation (Medical): Not on file  . Lack of Transportation (Non-Medical): Not on file  Physical Activity:   . Days of Exercise per Week: Not on file  . Minutes of Exercise per Session: Not on file  Stress:   . Feeling of Stress : Not on file  Social Connections:   . Frequency of Communication with Friends and Family: Not on file  . Frequency of Social Gatherings with Friends and Family: Not on file  . Attends Religious Services: Not on file  . Active Member of Clubs or Organizations: Not on file  . Attends CArchivistMeetings: Not on file  . Marital Status: Not on file    Family History: Family History  Problem Relation Age of Onset  . Hypertension Mother   . Heart disease Maternal Grandmother   . Hypertension Maternal Grandmother   . Diabetes Maternal Grandmother   . Stroke Maternal Grandfather   . Heart  disease Maternal Grandfather   . Heart attack Maternal Grandfather     Allergies: Allergies  Allergen Reactions  . Contrast Media [Iodinated Diagnostic Agents] Other (See Comments)    Pt states she blacked out in the machine    Facility-Administered Medications Prior to Admission  Medication Dose Route Frequency Provider Last Rate Last Admin  . insulin starter kit- syringes (English) 1 kit  1 kit Other Once Constant, Peggy, MD      . metFORMIN (GLUCOPHAGE) tablet 1,000 mg  1,000 mg Oral QHS Woodroe Mode, MD       Medications Prior to Admission  Medication Sig Dispense Refill Last Dose  . Acetaminophen (TYLENOL PO) Take by mouth as needed.    Past Month at Unknown time  . aspirin EC 81 MG tablet Take 1 tablet (81 mg total) by mouth daily. Take after 12 weeks for prevention of preeclampsia later in pregnancy 300 tablet 2 09/30/2020 at Unknown time   . insulin NPH Human (HUMULIN N) 100 UNIT/ML injection Inject 0.2 mLs (20 Units total) into the skin at bedtime. 10 mL 11 09/30/2020 at Unknown time  . metoCLOPramide (REGLAN) 5 MG tablet Take 1 tablet (5 mg total) by mouth 4 (four) times daily -  before meals and at bedtime. 60 tablet 2 09/30/2020 at Unknown time  . Prenatal MV-Min-FA-Omega-3 (PRENATAL GUMMIES/DHA & FA PO) Take by mouth.   09/30/2020 at Unknown time  . Accu-Chek Softclix Lancets lancets Use as instructed 100 each 12   . Blood Glucose Monitoring Suppl (ACCU-CHEK GUIDE) w/Device KIT 1 Device by Does not apply route in the morning, at noon, in the evening, and at bedtime. 1 kit 0   . Blood Pressure Monitoring (BLOOD PRESSURE KIT) DEVI 1 kit by Does not apply route once a week. Check Blood Pressure regularly and record readings into the Babys[employer]s App.  Large Cuff.  DX O90.0 1 each 0   . glucose blood (ACCU-CHEK GUIDE) test strip Use to check blood sugars four times a day was instructed 50 each 12   . insulin regular (NOVOLIN R) 100 units/mL injection Inject 17 Units into the skin once. (Patient not taking: Reported on 09/01/2020)     . Insulin Syringe-Needle U-100 (INSULIN SYRINGE .3CC/29GX1") 29G X 1" 0.3 ML MISC 1 Device by Does not apply route as needed. 60 each 0   . metFORMIN (GLUCOPHAGE) 500 MG tablet Take by mouth 2 (two) times daily with a meal.  (Patient not taking: Reported on 08/19/2020)   Unknown at Unknown time  . pantoprazole (PROTONIX) 40 MG tablet Take 1 tablet (40 mg total) by mouth daily. (Patient not taking: Reported on 09/22/2020) 30 tablet 1 Unknown at Unknown time     Review of Systems   All systems reviewed and negative except as stated in HPI  Blood pressure 130/85, pulse 88, temperature 98.7 F (37.1 C), temperature source Oral, resp. rate 18, height '5\' 6"'  (1.676 m), weight 87.5 kg, last menstrual period 02/01/2020. General appearance: alert, cooperative and appears stated age, no distress Lungs: normal  respiratory effort, no respiratory distress Heart: regular rate  Abdomen: soft, non-tender Presentation: cephalic Fetal monitoringBaseline: 150 bpm, Variability: Good {> 6 bpm), Accelerations: Reactive and Decelerations: Absent Uterine activity irregular Dilation: 3.5 Effacement (%): 60 Station: -2 Exam by:: A. 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 Maternal Ultrasounds/Referrals: Normal Fetal Ultrasounds or other Referrals:  Referred to Materal Fetal Medicine  Maternal Substance Abuse:  No  Significant Maternal Medications:  Meds include: Protonix Other:  metformin, insulin Significant Maternal Lab Results: Group B Strep negative  Results for orders placed or performed during the hospital encounter of 10/01/20 (from the past 24 hour(s))  CBC   Collection Time: 10/01/20 12:41 PM  Result Value Ref Range   WBC 5.7 4.0 - 10.5 K/uL   RBC 4.28 3.87 - 5.11 MIL/uL   Hemoglobin 11.5 (L) 12.0 - 15.0 g/dL   HCT 36.4 36 - 46 %   MCV 85.0 80.0 - 100.0 fL   MCH 26.9 26.0 - 34.0 pg   MCHC 31.6 30.0 - 36.0 g/dL   RDW 17.5 (H) 11.5 - 15.5 %   Platelets 215 150 - 400 K/uL   nRBC 0.0 0.0 - 0.2 %  Type and screen   Collection Time: 10/01/20 12:42 PM  Result Value Ref Range   ABO/RH(D) PENDING    Antibody Screen PENDING    Sample Expiration      10/04/2020,2359 Performed at [facility], [address] [place], [place]  [address]     Patient Active Problem List   Diagnosis Date Noted  . [redacted] weeks gestation of pregnancy 09/29/2020  . [redacted] weeks gestation of pregnancy 09/21/2020  . Borderline hypertension 08/23/2020  . Diabetes mellitus affecting pregnancy 04/10/2020  . Supervision of high-risk pregnancy 03/30/2020  . AMA (advanced maternal age) multigravida 35+ 03/30/2020  . Maternal obesity affecting pregnancy, antepartum 03/30/2020  . History of gestational hypertension 03/30/2020  . Chest pain 12/03/2017    Assessment/Plan:  LOREL LEMBO is a 39 y.o. G3P2002 at 66w0dhere for IOL for A2GDM  #Labor: First cervical check dilated to 3-4 cm. Will start pitocin 2x2.  #Pain: Declines pain meds at this time, open to epidural at this time  #FWB: Cat 1 #ID:  N/a, GBS negative #MOF: Breast and bottle. G3P2 who would like to attempt breastfeeding for the first time. Will consult lactation postpartum.  #MOC: PP BTL, papers signed, private insurance ([employer]) #Circ:  Desired #A2GDM: POC glucose q4 #Borderline hypertension: Monitor BP, admission BP 130/85. No BP meds prenatally.  #History of gestational hypertension  CEzequiel Essex MD  10/01/2020, 1:07 PM  I was present for the exam and agree with above. EFW (3500) 7-15 (69%tile). Likely BDM w/ early Dx, Glucose >200. EFW 69 %tile. Pelvis proven to 3600 gm. [facility]. Watch labor curve closely.   STamala Julian [place]11/04/2020 5:29 PM

## 2020-10-01 NOTE — Progress Notes (Signed)
LABOR PROGRESS NOTE  Diane Snyder is a 39 y.o. G3P2002 at [redacted]w[redacted]d  admitted for IOL A2GDM.   Subjective: Doing well and resting comfortably at this time.   Objective: BP (!) 144/91   Pulse 84   Temp 97.8 F (36.6 C) (Oral)   Resp 18   Ht 5\' 6"  (1.676 m)   Wt 87.5 kg   LMP 02/01/2020 Comment: spotting only  BMI 31.13 kg/m  or  Vitals:   10/01/20 2101 10/01/20 2131 10/01/20 2251 10/01/20 2301  BP: 117/64 117/61  (!) 144/91  Pulse: 76 70  84  Resp:      Temp:   97.8 F (36.6 C)   TempSrc:   Oral   Weight:      Height:       Dilation: 5 Effacement (%): 80 Cervical Position: Posterior, Middle Station: -1 Presentation: Vertex Exam by:: Dr. Astrid Drafts FHT: baseline rate 135 bpm, moderate varibility, 10 x 10 acel, early decels Toco: 2-3 mins  Labs: Lab Results  Component Value Date   WBC 5.7 10/01/2020   HGB 11.5 (L) 10/01/2020   HCT 36.4 10/01/2020   MCV 85.0 10/01/2020   PLT 215 10/01/2020    Patient Active Problem List   Diagnosis Date Noted  . Borderline hypertension 08/23/2020  . Diabetes mellitus affecting pregnancy 04/10/2020  . Supervision of high-risk pregnancy 03/30/2020  . AMA (advanced maternal age) multigravida 35+ 03/30/2020  . Maternal obesity affecting pregnancy, antepartum 03/30/2020  . History of gestational hypertension 03/30/2020  . Chest pain 12/03/2017    Assessment / Plan: 39 y.o. G3P2002 at [redacted]w[redacted]d here for IOL 2/2 A2DM.   A2GDM -CBGs q4h in latent labor  Hx of gHTN  Borderline hypertension  C/f preE Most recent BP 144/91. UPC 0.18. CMP wnl -Continue monitor BPs  Labor: AROM (2245), Pit (22 mu/min) Fetal Wellbeing:  Cat I Pain Control:  Epidural Anticipated MOD:  Vaginal  Taariq Leitz Autry-Lott, DO 10/01/2020, 11:37 PM PGY-2, [place]

## 2020-10-01 NOTE — Anesthesia Procedure Notes (Signed)
Epidural Patient location during procedure: OB Start time: 10/01/2020 11:29 PM End time: 10/01/2020 11:36 PM  Staffing Anesthesiologist: Josephine Igo, MD Performed: anesthesiologist   Preanesthetic Checklist Completed: patient identified, IV checked, site marked, risks and benefits discussed, surgical consent, monitors and equipment checked, pre-op evaluation and timeout performed  Epidural Patient position: sitting Prep: DuraPrep and site prepped and draped Patient monitoring: continuous pulse ox and blood pressure Approach: midline Location: L4-L5 Injection technique: LOR air  Needle:  Needle type: Tuohy  Needle gauge: 17 G Needle length: 9 cm and 9 Needle insertion depth: 5 cm cm Catheter type: closed end flexible Catheter size: 19 Gauge Catheter at skin depth: 10 cm Test dose: negative and Other  Assessment Events: blood not aspirated, injection not painful, no injection resistance, no paresthesia and negative IV test  Additional Notes Patient identified. Risks and benefits discussed including failed block, incomplete  Pain control, post dural puncture headache, nerve damage, paralysis, blood pressure Changes, nausea, vomiting, reactions to medications-both toxic and allergic and post Partum back pain. All questions were answered. Patient expressed understanding and wished to proceed. Sterile technique was used throughout procedure. Epidural site was Dressed with sterile barrier dressing. No paresthesias, signs of intravascular injection Or signs of intrathecal spread were encountered.  Patient was more comfortable after the epidural was dosed. Please see RN's note for documentation of vital signs and FHR which are stable. Reason for block:procedure for pain

## 2020-10-01 NOTE — Anesthesia Preprocedure Evaluation (Signed)
Anesthesia Evaluation  Patient identified by MRN, date of birth, ID band  Reviewed: Allergy & Precautions, Patient's Chart, lab work & pertinent test results  Airway Mallampati: III  TM Distance: >3 FB Neck ROM: Full    Dental no notable dental hx.    Pulmonary former smoker,    Pulmonary exam normal breath sounds clear to auscultation       Cardiovascular negative cardio ROS Normal cardiovascular exam Rhythm:Regular Rate:Normal     Neuro/Psych negative neurological ROS  negative psych ROS   GI/Hepatic Neg liver ROS, GERD  Medicated and Controlled,  Endo/Other  diabetes, Well Controlled, Gestational, Insulin DependentObesity  Renal/GU negative Renal ROS  negative genitourinary   Musculoskeletal negative musculoskeletal ROS (+)   Abdominal (+) + obese,   Peds  Hematology negative hematology ROS (+)   Anesthesia Other Findings   Reproductive/Obstetrics (+) Pregnancy Gestational DM                             Anesthesia Physical Anesthesia Plan  ASA: II  Anesthesia Plan: Epidural   Post-op Pain Management:    Induction:   PONV Risk Score and Plan:   Airway Management Planned: Natural Airway  Additional Equipment:   Intra-op Plan:   Post-operative Plan:   Informed Consent: I have reviewed the patients History and Physical, chart, labs and discussed the procedure including the risks, benefits and alternatives for the proposed anesthesia with the patient or authorized representative who has indicated his/her understanding and acceptance.       Plan Discussed with: Anesthesiologist  Anesthesia Plan Comments:         Anesthesia Quick Evaluation

## 2020-10-01 NOTE — Plan of Care (Signed)
  Problem: Education: Goal: Ability to make informed decisions regarding treatment and plan of care will improve Outcome: Progressing Goal: Ability to state and carry out methods to decrease the pain will improve Outcome: Progressing   Problem: Pain Management: Goal: Relief or control of pain from uterine contractions will improve Outcome: Progressing   Problem: Pain Managment: Goal: General experience of comfort will improve Outcome: Progressing

## 2020-10-01 NOTE — Progress Notes (Signed)
Labor Progress Note JENNE SELLINGER is a 39 y.o. G3P2002 at [redacted]w[redacted]d presented for IOL due to A2GDM.   S: She is doing well, feeling pressure and pain with contractions. Reports 7/10 pain and requests IV pain medication. Also is experiencing some hemorrhoidal pain and is requesting pain relief cream for that.  O:  BP 128/77   Pulse 79   Temp 98.4 F (36.9 C) (Oral)   Resp 18   Ht 5\' 6"  (1.676 m)   Wt 87.5 kg   LMP 02/01/2020 Comment: spotting only  BMI 31.13 kg/m  EFM: baseline 150 bpm / moderate variability / accels present, no decels Toco: every 3-5 minutes  CVE: Dilation: 4.5 Effacement (%): 70 Cervical Position: Posterior, Middle Station: -2 Presentation: Vertex Exam by:: Dr. Jeani Hawking   A&P: 39 y.o. [id number] [redacted]w[redacted]d presented for IOL due to A2GDM.  #Labor: Progressing well. Will continue on pitocin, increasing 2x2.  #Pain: IV fentanyl #FWB: Cat 1 #GBS negative #A2GDM: POC glucose q4, last two 78, 89.  #Borderline HTN in pregnancy: BP thus far (118-135)/(77-86), last 132/86  Ezequiel Essex, MD 7:06 PM

## 2020-10-02 ENCOUNTER — Encounter (HOSPITAL_COMMUNITY): Payer: Self-pay | Admitting: Obstetrics and Gynecology

## 2020-10-02 DIAGNOSIS — O99214 Obesity complicating childbirth: Secondary | ICD-10-CM

## 2020-10-02 LAB — RPR: RPR Ser Ql: NONREACTIVE

## 2020-10-02 LAB — GLUCOSE, CAPILLARY: Glucose-Capillary: 116 mg/dL — ABNORMAL HIGH (ref 70–99)

## 2020-10-02 MED ORDER — ACETAMINOPHEN 325 MG PO TABS
650.0000 mg | ORAL_TABLET | Freq: Four times a day (QID) | ORAL | Status: DC
  Administered 2020-10-03 – 2020-10-04 (×3): 650 mg via ORAL
  Filled 2020-10-02 (×4): qty 2

## 2020-10-02 MED ORDER — BENZOCAINE-MENTHOL 20-0.5 % EX AERO
1.0000 "application " | INHALATION_SPRAY | CUTANEOUS | Status: DC | PRN
  Administered 2020-10-02: 1 via TOPICAL
  Filled 2020-10-02: qty 56

## 2020-10-02 MED ORDER — WITCH HAZEL-GLYCERIN EX PADS: 1.0000 "application " | MEDICATED_PAD | CUTANEOUS | Status: DC | PRN

## 2020-10-02 MED ORDER — SENNOSIDES-DOCUSATE SODIUM 8.6-50 MG PO TABS
2.0000 | ORAL_TABLET | ORAL | Status: DC
  Administered 2020-10-03: 2 via ORAL
  Filled 2020-10-02 (×2): qty 2

## 2020-10-02 MED ORDER — COCONUT OIL OIL
1.0000 "application " | TOPICAL_OIL | Status: DC | PRN
  Administered 2020-10-02: 1 via TOPICAL

## 2020-10-02 MED ORDER — ONDANSETRON HCL 4 MG PO TABS: 4.0000 mg | ORAL_TABLET | ORAL | Status: DC | PRN

## 2020-10-02 MED ORDER — ONDANSETRON HCL 4 MG/2ML IJ SOLN: 4.0000 mg | INTRAMUSCULAR | Status: DC | PRN

## 2020-10-02 MED ORDER — DIPHENHYDRAMINE HCL 25 MG PO CAPS: 25.0000 mg | ORAL_CAPSULE | Freq: Four times a day (QID) | ORAL | Status: DC | PRN

## 2020-10-02 MED ORDER — DIBUCAINE (PERIANAL) 1 % EX OINT: 1.0000 "application " | TOPICAL_OINTMENT | CUTANEOUS | Status: DC | PRN

## 2020-10-02 MED ORDER — IBUPROFEN 600 MG PO TABS
600.0000 mg | ORAL_TABLET | Freq: Four times a day (QID) | ORAL | Status: DC
  Administered 2020-10-02 – 2020-10-04 (×8): 600 mg via ORAL
  Filled 2020-10-02 (×10): qty 1

## 2020-10-02 MED ORDER — SIMETHICONE 80 MG PO CHEW: 80.0000 mg | CHEWABLE_TABLET | ORAL | Status: DC | PRN

## 2020-10-02 MED ORDER — TETANUS-DIPHTH-ACELL PERTUSSIS 5-2.5-18.5 LF-MCG/0.5 IM SUSY: 0.5000 mL | PREFILLED_SYRINGE | Freq: Once | INTRAMUSCULAR | Status: DC

## 2020-10-02 MED ORDER — PRENATAL MULTIVITAMIN CH
1.0000 | ORAL_TABLET | Freq: Every day | ORAL | Status: DC
  Administered 2020-10-02 – 2020-10-04 (×3): 1 via ORAL
  Filled 2020-10-02 (×3): qty 1

## 2020-10-02 NOTE — Lactation Note (Signed)
This note was copied from a baby's chart. Lactation Consultation Note  Patient Name: Diane Snyder [id number]'L Date: 10/02/2020 Reason for consult: Follow-up assessment   Mother is a P3, infant is 46 hours old .mother reports that she was told that she should start giving formula now and use the hand pump .Marland Kitchen  Mother reports that she would still like to breast feed infant.   Discussed with her the use of a nipple shield. Mother was agreeable to the use of the nipple shield  for the next feeding. Mother has a hand pump  At the bedside.   Discussed using the electric pump .    Mother to continue to cue base feed infant and feed at least 8-12 times or more in 24 hours and advised to allow for cluster feeding infant as needed.  Mother to continue to due STS. Mother is aware of available LC services at [facility], BFSG'S, OP Dept, and phone # for questions or concerns about breastfeeding.  Mother receptive to all teaching and plan of care.     Maternal Data    Feeding Feeding Type: Bottle Fed - Formula Nipple Type: Slow - flow  LATCH Score                   Interventions    Lactation Tools Discussed/Used     Consult Status Consult Status: Follow-up Date: 10/03/20 Follow-up type: In-patient    Jess Barters [facility] 10/02/2020, 3:17 PM

## 2020-10-02 NOTE — Lactation Note (Addendum)
This note was copied from a baby's chart. Lactation Consultation Note  Infant is 65 weeks 55 hours old. Mom hx of Gestational Diabetes and Hypertenson but is on no meds at this time. Mom's choice to offer breast and formula.   Mom last attempt at the breast 2 am this morinng for about 20 minutes. Mom is giving Fish farm manager Goodstart 15 ml per feeding with a yellow slow flow nipple. Last feeing at 2:30 pm 15 ml given.   LC reviewed breastfeeding supplementation guidelines based on hours after birth. Mom is aware to first latch infant at the breast then use the 20 NS. Mom to offer both breasts during the feed and look for signs of milk transfer. Mom to follow up with Dory Horn good start 7-12 ml supplementation increasing based on guidelines given.    Mom has flat compressible nipples that will erect with stimulation. LC applied heat, breast massage and hand expression, no colostrum noted. [place] set up DEBP and Mom pumped for 10 minutes with drops of colostrum noted from the right breast. LC increased flange size from 24 to 27. Mom will apply coconut oil for nipple care and when using the flange. Message left to RN, Windy Carina, to give her coconut oil. Mom also given breast shells by RN. LC reviewed how to use them and remove them when sleeping to help bring her nipples out.   Infant latched using the 20 NS primed with 0.1ml of formula. Infant latched for 15 minutes with signs of milk transfer. Infant has a tight labial frenulum and latches shallow with tight grip using bottom gum. LC did suck training and infant will extend the tongue pass the gum line. Mom complains of pain at the nipple when latched in football. LC moved Mom to cross cradle lying her more supine and she stated pain diminished.   Mom supplementing with Dory Horn good start at the end of the feed.     Plan 1. To feed based on cues 8-12x in 24 hour period no more than 4 hours without an attempt. Mom to place infant STS and look for signs of milk  transfer.          2. Mom to supplement using EBM or Fawn Kirk according to guidelines reviewed above using paced bottle feeding and slow flow nipple           3 Mom to pump using DEBP q 3 hours for 15 minutes. Mom is aware NS effect milk supply so she will need to pump.           4. [place] brochure of inpatient and outpatient services reviewed.            5. LC informed RN Mom will need assistance with latching.

## 2020-10-02 NOTE — Discharge Summary (Signed)
Postpartum Discharge Summary  Date of Service updated 10/04/20    Patient Name: Diane Snyder DOB: 10/18/1981 MRN: [id number]  Date of admission: 10/01/2020 Delivery date:10/02/2020  Delivering provider: Randa Ngo  Date of discharge: 10/04/2020  Admitting diagnosis: Supervision of high-risk pregnancy [O09.90] Intrauterine pregnancy: [redacted]w[redacted]d    Secondary diagnosis:  Principal Problem:   Vaginal delivery Active Problems:   Supervision of high-risk pregnancy   AMA (advanced maternal age) multigravida 35+   Maternal obesity affecting pregnancy, antepartum   History of gestational hypertension   Diabetes mellitus affecting pregnancy   Borderline hypertension  Additional problems: as noted above    Discharge diagnosis:  Vaginal delivery                                          Post partum procedures:none Augmentation: Pitocin Complications: None  Hospital course: Induction of Labor With Vaginal Delivery   39y.o. yo G3P3003 at 310w1das admitted to the hospital 10/01/2020 for induction of labor.  Indication for induction: A2 DM.  Patient had an uncomplicated labor course as follows: Membrane Rupture Time/Date: 10:43 PM ,10/01/2020   Delivery Method:Vaginal, Spontaneous  Episiotomy: None  Lacerations:  None  Details of delivery can be found in separate delivery note.  Patient had a routine postpartum course. Patient is discharged home 10/04/20.  Newborn Data: Birth date:10/02/2020  Birth time:1:38 AM  Gender:Female  Living status:Living  Apgars:8 ,9  Weight:3515 g   Magnesium Sulfate received: No BMZ received: No Rhophylac:N/A MMR:N/A T-DaP:Given prenatally Flu: Yes Transfusion:No  Physical exam  Vitals:   10/02/20 2205 10/03/20 0600 10/03/20 1427 10/03/20 2159  BP: 121/83 124/83 121/80 117/82  Pulse: 85 87 72 72  Resp: '18 18 16 16  ' Temp: 97.8 F (36.6 C) 98 F (36.7 C) 98.3 F (36.8 C) 97.9 F (36.6 C)  TempSrc: Axillary Oral Oral Oral  SpO2: 99% 100%  99% 99%  Weight:      Height:       General: alert, cooperative and no distress Lochia: appropriate Uterine Fundus: firm Incision: N/A DVT Evaluation: No evidence of DVT seen on physical exam. Labs: Lab Results  Component Value Date   WBC 5.7 10/01/2020   HGB 11.5 (L) 10/01/2020   HCT 36.4 10/01/2020   MCV 85.0 10/01/2020   PLT 215 10/01/2020   CMP Latest Ref Rng & Units 10/01/2020  Glucose 70 - 99 mg/dL 113(H)  BUN 6 - 20 mg/dL <5(L)  Creatinine 0.44 - 1.00 mg/dL 0.68  Sodium 135 - 145 mmol/L 135  Potassium 3.5 - 5.1 mmol/L 3.3(L)  Chloride 98 - 111 mmol/L 102  CO2 22 - 32 mmol/L 20(L)  Calcium 8.9 - 10.3 mg/dL 9.5  Total Protein 6.5 - 8.1 g/dL 6.5  Total Bilirubin 0.3 - 1.2 mg/dL 0.8  Alkaline Phos 38 - 126 U/L 153(H)  AST 15 - 41 U/L 27  ALT 0 - 44 U/L 13   Edinburgh Score: Edinburgh Postnatal Depression Scale Screening Tool 10/03/2020  I have been able to laugh and see the funny side of things. 0  I have looked forward with enjoyment to things. 0  I have blamed myself unnecessarily when things went wrong. 0  I have been anxious or worried for no good reason. 0  I have felt scared or panicky for no good reason. 0  Things have been getting on top  of me. 0  I have been so unhappy that I have had difficulty sleeping. 0  I have felt sad or miserable. 0  I have been so unhappy that I have been crying. 0  The thought of harming myself has occurred to me. 0  Edinburgh Postnatal Depression Scale Total 0     After visit meds:  Allergies as of 10/04/2020      Reactions   Contrast Media [iodinated Diagnostic Agents] Other (See Comments)   Pt states she blacked out in the machine      Medication List    STOP taking these medications   Accu-Chek Guide test strip Generic drug: glucose blood   Accu-Chek Guide w/Device Kit   Accu-Chek Softclix Lancets lancets   aspirin EC 81 MG tablet   calcium carbonate 500 MG chewable tablet Commonly known as: TUMS - dosed in mg  elemental calcium   insulin NPH Human 100 UNIT/ML injection Commonly known as: HumuLIN N   insulin regular 100 units/mL injection Commonly known as: NOVOLIN R   INSULIN SYRINGE .3CC/29GX1" 29G X 1" 0.3 ML Misc   metFORMIN 500 MG tablet Commonly known as: GLUCOPHAGE   metoCLOPramide 5 MG tablet Commonly known as: Reglan   pantoprazole 40 MG tablet Commonly known as: Protonix     TAKE these medications   acetaminophen 325 MG tablet Commonly known as: Tylenol Take 2 tablets (650 mg total) by mouth every 6 (six) hours. What changed:   medication strength  how much to take  when to take this  reasons to take this   Blood Pressure Kit Devi 1 kit by Does not apply route once a week. Check Blood Pressure regularly and record readings into the [employer] App.  Large Cuff.  DX O90.0   coconut oil Oil Apply 1 application topically as needed.   ibuprofen 600 MG tablet Commonly known as: ADVIL Take 1 tablet (600 mg total) by mouth every 6 (six) hours.   PRENATAL GUMMIES/DHA & FA PO Take 2 tablets by mouth daily.        Discharge home in stable condition Infant Feeding: breast & bottle Infant Disposition:home with mother Discharge instruction: per After Visit Summary and Postpartum booklet. Activity: Advance as tolerated. Pelvic rest for 6 weeks.  Diet: routine diet Future Appointments:No future appointments. Follow up Visit: message sent to [facility] 10/04/20  Please schedule this patient for a In person postpartum visit in 6 weeks with the following provider: Any provider. 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

## 2020-10-02 NOTE — Progress Notes (Signed)
Results for NATOSHIA, SOUTER (MRN [id number]) as of 10/02/2020 07:00  Ref. Range 10/02/2020 06:50  Glucose-Capillary Latest Ref Range: 70 - 99 mg/dL 116 (H)   Pt reports recent consumption of 30+mL of orange juice.   Rocky Crafts, RN 10/02/20

## 2020-10-02 NOTE — Lactation Note (Signed)
This note was copied from a baby's chart. Lactation Consultation Note Baby 2 hrs old. Mom is tired, baby is sleepy.  Mom eating. Lab to draw baby's blood for glucose. [place] collected a few drops of colostrum. Gave to baby w/gloved finger while getting shots and drawing labs. Baby biting hard, clamping. Tongue appears tight w/mobility. Has thick labial frenulum to gum line.  Mom has 53 and 37 yr old that she didn't BF. Mom would like to try to BF but wants to do both breast and formula.  Newborn behavior, feeding habits, STS, importance of I&O, milk storage, supply and demand discussed.  Mom has short shaft nipples/semi flat. Hand expression w/ a lot of massage collected a few drops of colostrum. Encouraged mom to rest when baby rest. Call for assistance or questions. Lactation brochure given.  Patient Name: Diane Snyder [id number] Date: 10/02/2020 Reason for consult: Initial assessment;Maternal endocrine disorder;Term;1st time breastfeeding Type of Endocrine Disorder?: Diabetes   Maternal Data Does the patient have breastfeeding experience prior to this delivery?: No  Feeding Feeding Type: Breast Milk Nipple Type: Slow - flow  LATCH Score Latch: Repeated attempts needed to sustain latch, nipple held in mouth throughout feeding, stimulation needed to elicit sucking reflex.  Audible Swallowing: A few with stimulation  Type of Nipple: Flat (semi flat/very short shaft)  Comfort (Breast/Nipple): Soft / non-tender  Hold (Positioning): Assistance needed to correctly position infant at breast and maintain latch.  LATCH Score: 7  Interventions Interventions: Breast massage;Expressed milk;Hand express;Hand pump;Shells  Lactation Tools Discussed/Used Tools: Shells;Pump Shell Type: Inverted Breast pump type: Manual WIC Program: Yes   Consult Status Consult Status: Follow-up Date: 10/02/20 Follow-up type: In-patient    Theodoro Kalata 10/02/2020, 3:52 AM

## 2020-10-02 NOTE — Discharge Instructions (Signed)

## 2020-10-02 NOTE — Anesthesia Postprocedure Evaluation (Signed)
Anesthesia Post Note  Patient: Diane Snyder  Procedure(s) Performed: AN AD HOC LABOR EPIDURAL     Patient location during evaluation: Mother Baby Anesthesia Type: Epidural Level of consciousness: awake and alert Pain management: pain level controlled Vital Signs Assessment: post-procedure vital signs reviewed and stable Respiratory status: spontaneous breathing, nonlabored ventilation and respiratory function stable Cardiovascular status: stable Postop Assessment: no headache, no backache and epidural receding Anesthetic complications: no   No complications documented.  Last Vitals:  Vitals:   10/02/20 0340 10/02/20 0806  BP: 129/85 128/86  Pulse: 87 75  Resp: 18 18  Temp: 36.7 C 36.8 C  SpO2: 100% 100%    Last Pain:  Vitals:   10/02/20 0806  TempSrc: Oral  PainSc: 0-No pain   Pain Goal:                   Rayvon Char

## 2020-10-03 LAB — GLUCOSE, CAPILLARY: Glucose-Capillary: 91 mg/dL (ref 70–99)

## 2020-10-03 NOTE — Progress Notes (Addendum)
Post Partum Day 1 Subjective: no complaints, up ad lib, voiding and tolerating PO  Objective: Blood pressure 124/83, pulse 87, temperature 98 F (36.7 C), temperature source Oral, resp. rate 18, height 5\' 6"  (1.676 m), weight 87.5 kg, last menstrual period 02/01/2020, SpO2 100 %, unknown if currently breastfeeding.  Physical Exam:  General: alert and no distress Lochia: appropriate Uterine Fundus: firm Incision: n/a DVT Evaluation: No evidence of DVT seen on physical exam. No cords or calf tenderness. No significant calf/ankle edema.  Recent Labs    10/01/20 1241  HGB 11.5*  HCT 36.4    Assessment/Plan: Plan for discharge tomorrow, Breastfeeding and Circumcision prior to discharge   LOS: 2 days   Baldo Ash 10/03/2020, 7:55 AM    CNM attestation:  I have seen and examined this patient and agree with above documentation in the resident student's note.   SHAWNESE MAGNER is a 39 y.o. G3P3003 at [redacted]w[redacted]d doing well, baby has slightly high bilirubin per peds so will most likely mom and baby will stay another day.   PE:  Gen: calm comfortable, NAD Resp: normal effort, no distress Heart: Regular rate Abd: Soft, NT, gravid, S=D  ROS, labs, PMH reviewed  Fasting BS was 91  Assessment: NSVD, patient stable  Plan: - discharge tomorrow -plan for 2 hour GTT postpartum to follow up on [place], [place], [place] 10/03/2020 11:54 AM

## 2020-10-03 NOTE — Lactation Note (Signed)
This note was copied from a baby's chart. Lactation Consultation Note  Patient Name: Diane Snyder [id number] Date: 10/03/2020 Reason for consult: Follow-up assessment   P3, Baby 57 hours old and mother giving bottle of formula upon entering. At this time lactation does not need to visit this mother unless she requests.  She states pumping hurt and she did not like it.  Discussed turning down suction. Mother is undecided about whether she will continue to breastfeed or not. She denies problems with latching.  Dicussed options including breastfeeding before offering formula to help establish her milk supply or cabbage leaves if she decided not to breastfeed.    Maternal Data    Feeding Feeding Type: Bottle Fed - Formula Nipple Type: Extra Slow Flow  LATCH Score                   Interventions Interventions: DEBP  Lactation Tools Discussed/Used     Consult Status Consult Status: Complete Date: 10/03/20    [name] 10/03/2020, 11:51 AM

## 2020-10-04 MED ORDER — COCONUT OIL OIL
1.0000 | TOPICAL_OIL | 0 refills | Status: DC | PRN
Start: 2020-10-04 — End: 2022-12-24

## 2020-10-04 MED ORDER — IBUPROFEN 600 MG PO TABS
600.0000 mg | ORAL_TABLET | Freq: Four times a day (QID) | ORAL | 0 refills | Status: DC
Start: 2020-10-04 — End: 2022-12-24

## 2020-10-04 MED ORDER — ACETAMINOPHEN 325 MG PO TABS
650.0000 mg | ORAL_TABLET | Freq: Four times a day (QID) | ORAL | Status: DC
Start: 2020-10-04 — End: 2022-12-24

## 2020-10-11 ENCOUNTER — Telehealth: Payer: Self-pay

## 2020-10-11 ENCOUNTER — Telehealth (INDEPENDENT_AMBULATORY_CARE_PROVIDER_SITE_OTHER): Payer: 59 | Admitting: Obstetrics

## 2020-10-11 ENCOUNTER — Encounter: Payer: Self-pay | Admitting: Obstetrics

## 2020-10-11 DIAGNOSIS — O872 Hemorrhoids in the puerperium: Secondary | ICD-10-CM

## 2020-10-11 DIAGNOSIS — K5901 Slow transit constipation: Secondary | ICD-10-CM | POA: Diagnosis not present

## 2020-10-11 NOTE — Progress Notes (Signed)
I connected with Diane Snyder on 10/11/20 at  1:30 PM EST by telephone and verified that I am speaking with the correct person using two identifiers.  SVD on 10/02/20 c/o abdominal pain while pumping breast milk and she c/o constipation  Last BM was this morning

## 2020-10-11 NOTE — Progress Notes (Signed)
GYNECOLOGY VIRTUAL VISIT ENCOUNTER NOTE  Provider location: [facility] [place]   I connected with Fabio Pierce on 10/11/20 at  1:30 PM EST by MyChart Video Encounter at home and verified that I am speaking with the correct person using two identifiers.   I discussed the limitations, risks, security and privacy concerns of performing an evaluation and management service virtually and the availability of in person appointments. I also discussed with the patient that there may be a patient responsible charge related to this service. The patient expressed understanding and agreed to proceed.   History:  JIMENA WIECZOREK is a 39 y.o. [id number] female being evaluated today for lower abdominal pain. She denies any abnormal vaginal discharge, bleeding, pelvic pain or other concerns.       Past Medical History:  Diagnosis Date  . Diverticulosis   . GDM (gestational diabetes mellitus) 03/2020  . Gestational diabetes   . History of gestational hypertension    Past Surgical History:  Procedure Laterality Date  . CHOLECYSTECTOMY     The following portions of the patient's history were reviewed and updated as appropriate: allergies, current medications, past family history, past medical history, past social history, past surgical history and problem list.    Review of Systems:  Pertinent items noted in HPI and remainder of comprehensive ROS otherwise negative.  Physical Exam:   General:  Alert, oriented and cooperative. Patient appears to be in no acute distress.  Mental Status: Normal mood and affect. Normal behavior. Normal judgment and thought content.   Respiratory: Normal respiratory effort, no problems with respiration noted  Rest of physical exam deferred due to type of encounter  Labs and Imaging Results for orders placed or performed during the hospital encounter of 10/01/20 (from the past 336 hour(s))  CBC   Collection Time: 10/01/20 12:41 PM    Result Value Ref Range   WBC 5.7 4.0 - 10.5 K/uL   RBC 4.28 3.87 - 5.11 MIL/uL   Hemoglobin 11.5 (L) 12.0 - 15.0 g/dL   HCT 36.4 36 - 46 %   MCV 85.0 80.0 - 100.0 fL   MCH 26.9 26.0 - 34.0 pg   MCHC 31.6 30.0 - 36.0 g/dL   RDW 17.5 (H) 11.5 - 15.5 %   Platelets 215 150 - 400 K/uL   nRBC 0.0 0.0 - 0.2 %  RPR   Collection Time: 10/01/20 12:41 PM  Result Value Ref Range   RPR Ser Ql NON REACTIVE NON REACTIVE  Type and screen   Collection Time: 10/01/20 12:42 PM  Result Value Ref Range   ABO/RH(D) O POS    Antibody Screen NEG    Sample Expiration      10/04/2020,2359 Performed at [facility], [address], [place], [place] [address]   Glucose, capillary   Collection Time: 10/01/20  1:46 PM  Result Value Ref Range   Glucose-Capillary 78 70 - 99 mg/dL   Comment 1 Document in Chart   Comprehensive metabolic panel   Collection Time: 10/01/20  2:29 PM  Result Value Ref Range   Sodium 135 135 - 145 mmol/L   Potassium 3.3 (L) 3.5 - 5.1 mmol/L   Chloride 102 98 - 111 mmol/L   CO2 20 (L) 22 - 32 mmol/L   Glucose, Bld 113 (H) 70 - 99 mg/dL   BUN <5 (L) 6 - 20 mg/dL   Creatinine, Ser 0.68 0.44 - 1.00 mg/dL   Calcium 9.5 8.9 -  10.3 mg/dL   Total Protein 6.5 6.5 - 8.1 g/dL   Albumin 3.0 (L) 3.5 - 5.0 g/dL   AST 27 15 - 41 U/L   ALT 13 0 - 44 U/L   Alkaline Phosphatase 153 (H) 38 - 126 U/L   Total Bilirubin 0.8 0.3 - 1.2 mg/dL   GFR, Estimated >60 >60 mL/min   Anion gap 13 5 - 15  Glucose, capillary   Collection Time: 10/01/20  5:26 PM  Result Value Ref Range   Glucose-Capillary 86 70 - 99 mg/dL   Comment 1 Document in Chart   Protein / creatinine ratio, urine   Collection Time: 10/01/20  8:15 PM  Result Value Ref Range   Creatinine, Urine 148.67 mg/dL   Total Protein, Urine 27 mg/dL   Protein Creatinine Ratio 0.18 (H) 0.00 - 0.15 mg/mg[Cre]  Glucose, capillary   Collection Time: 10/01/20 10:12 PM  Result Value Ref Range   Glucose-Capillary 90 70 - 99 mg/dL   Glucose, capillary   Collection Time: 10/02/20  6:50 AM  Result Value Ref Range   Glucose-Capillary 116 (H) 70 - 99 mg/dL  Glucose, capillary   Collection Time: 10/03/20  5:55 AM  Result Value Ref Range   Glucose-Capillary 91 70 - 99 mg/dL  Results for orders placed or performed during the hospital encounter of 09/29/20 (from the past 336 hour(s))  SARS CORONAVIRUS 2 (TAT 6-24 HRS) Nasopharyngeal Nasopharyngeal Swab   Collection Time: 09/29/20 10:23 AM   Specimen: Nasopharyngeal Swab  Result Value Ref Range   SARS Coronavirus 2 NEGATIVE NEGATIVE   [place] MFM FETAL BPP WO NON STRESS  Result Date: 09/29/2020 ----------------------------------------------------------------------  OBSTETRICS REPORT                       (Signed Final 09/29/2020 12:10 pm) ---------------------------------------------------------------------- Patient Info  ID #:       [id number]                          D.O.B.:  October 04, 1981 (39 yrs)  Name:       SELDA JALBERT              Visit Date: 09/29/2020 08:30 am ---------------------------------------------------------------------- Performed By  Attending:        Tama High MD        Ref. Address:     Faculty  Performed By:     Rodrigo Ran BS      [facility]                                 [facility]                                                             [facility] [facility]  Referred By:      Vickii Chafe                    CONSTANT MD ---------------------------------------------------------------------- Orders  #  Description                           Code        Ordered By  1  [place] MFM FETAL BPP WO NON               76819.01    YU FANG     STRESS  2  [place] MFM OB FOLLOW UP                   [id number]    YU FANG ----------------------------------------------------------------------  #  Order #                     Accession #                Episode #  1  [id number]                    [id number]                 [id number]  2  [id number]                   [id number]                 [id number] ---------------------------------------------------------------------- Indications  [redacted] weeks gestation of pregnancy                Z3A.38  Gestational diabetes in pregnancy, insulin     O24.414  controlled  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)  Poor obstetric history: Previous               O09.299  preeclampsia / eclampsia/gestational HTN  Advanced maternal age multigravida 1+,        O63.523  third trimester  Obesity complicating pregnancy, third          O99.213  trimester  (BMI 33)  Uterine fibroids affecting pregnancy in third  O34.13, D25.9  trimester, antepartum  Encounter for other antenatal screening        Z36.2  follow-up ---------------------------------------------------------------------- Vital Signs                                                 Height:        5'6" ---------------------------------------------------------------------- Fetal Evaluation  Num Of Fetuses:         1  Fetal Heart Rate(bpm):  147  Cardiac Activity:       Observed  Presentation:           Cephalic  Placenta:               Posterior  P. Cord Insertion:      Previously Visualized  Amniotic Fluid  AFI FV:      Within normal limits  AFI Sum(cm)     %Tile       Largest Pocket(cm)  12.6            48          3.9  RUQ(cm)  RLQ(cm)       LUQ(cm)        LLQ(cm)  3.9           3.1           1.7            3.9 ---------------------------------------------------------------------- Biophysical Evaluation  Amniotic F.V:   Within normal limits       F. Tone:        Observed  F. Movement:    Observed                   Score:          8/8  F. Breathing:   Observed ---------------------------------------------------------------------- Biometry  BPD:      92.9  mm     G. Age:  37w 5d         55  %    CI:         75.2   %    70 - 86                                                           FL/HC:      22.1   %    20.6 - 23.4  HC:      339.8  mm     G. Age:  39w 1d         44  %    HC/AC:      0.97        0.87 - 1.06  AC:      351.6  mm     G. Age:  39w 0d         79  %    FL/BPD:     80.9   %    71 - 87  FL:       75.2  mm     G. Age:  38w 3d         54  %    FL/AC:      21.4   %    20 - 24  HUM:      63.9  mm     G. Age:  37w 0d         44  %  Est. FW:    3589  gm    7 lb 15 oz      69  % ---------------------------------------------------------------------- OB History  Gravidity:    3         Term:   2  Living:       2 ---------------------------------------------------------------------- Gestational Age  U/S Today:     38w 4d                                        EDD:   10/09/20  Best:          38w 5d     Det. By:  Previous Ultrasound      EDD:   10/08/20                                      (  03/16/20) ---------------------------------------------------------------------- Anatomy  Cranium:               Appears normal         Aortic Arch:            Previously seen  Cavum:                 Previously seen        Ductal Arch:            Previously seen  Ventricles:            Previously seen        Diaphragm:              Previously seen  Choroid Plexus:        Previously seen        Stomach:                Appears normal, left                                                                        sided  Cerebellum:            Previously seen        Abdomen:                Previously seen  Posterior Fossa:       Previously seen        Abdominal Wall:         Previously seen  Nuchal Fold:           Previously seen        Cord Vessels:           Previously seen  Face:                  Appears normal         Kidneys:                Appear normal                         (orbits and profile)  Lips:                  Appears normal         Bladder:                Appears normal  Thoracic:              Previously seen        Spine:                  Previously seen  Heart:                 Appears normal          Upper Extremities:      Previously seen                         (4CH, axis, and                         situs)  RVOT:  Appears normal         Lower Extremities:      Previously seen  LVOT:                  Appears normal  Other:  Heels and 5th digit previously visualized. Technically difficult due to          advanced GA and fetal position. ---------------------------------------------------------------------- Cervix Uterus Adnexa  Cervix  Not visualized (advanced GA >24wks) ---------------------------------------------------------------------- Impression  Gestational diabetes. Patient takes insulin for control .  Fetal growth is appropriate for gestational age .Amniotic fluid  is normal and good fetal activity is seen .Antenatal testing is  reassuring. BPP 8/8.  Patient will undergo induction of labor this week.  This study was remotely read . ----------------------------------------------------------------------                  Tama High, MD Electronically Signed Final Report   09/29/2020 12:10 pm ----------------------------------------------------------------------  [place] MFM FETAL BPP WO NON STRESS  Result Date: 09/22/2020 ----------------------------------------------------------------------  OBSTETRICS REPORT                       (Signed Final 09/22/2020 10:48 am) ---------------------------------------------------------------------- Patient Info  ID #:       [id number]                          D.O.B.:  September 29, 1981 (39 yrs)  Name:       JHOSELYN RUFFINI              Visit Date: 09/22/2020 10:13 am ---------------------------------------------------------------------- Performed By  Attending:        Johnell Comings MD         Ref. Address:     Faculty  Performed By:     Jacob Moores BS,       [facility]                    RDMS, RVT                                [facility] at                                                             [place] [facility]                                                              [facility]  Referred By:      Vickii Chafe                    CONSTANT MD ---------------------------------------------------------------------- Orders  #  Description                           Code        Ordered By  1  [place] MFM FETAL BPP WO NON  83662.94    [place] ----------------------------------------------------------------------  #  Order #                     Accession #                Episode #  1  [id number]                   [id number]                 [id number] ---------------------------------------------------------------------- Indications  [redacted] weeks gestation of pregnancy                Z3A.37  Gestational diabetes in pregnancy, insulin     O24.414  controlled  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)  Poor obstetric history: Previous               O09.299  preeclampsia / eclampsia/gestational HTN  Advanced maternal age multigravida 59+,        O74.523  third trimester  Obesity complicating pregnancy, third          O99.213  trimester  (BMI 33)  Uterine fibroids affecting pregnancy in third  O34.13, D25.9  trimester, antepartum  Encounter for other antenatal screening        Z36.2  follow-up ---------------------------------------------------------------------- Vital Signs                                                 Height:        5'6" ---------------------------------------------------------------------- Fetal Evaluation  Num Of Fetuses:         1  Fetal Heart Rate(bpm):  150  Cardiac Activity:       Observed  Presentation:           Cephalic  Placenta:               Posterior  P. Cord Insertion:      Previously Visualized  Amniotic Fluid  AFI FV:      Within normal limits  AFI Sum(cm)     %Tile       Largest Pocket(cm)  17.9            69          6.5  RUQ(cm)       RLQ(cm)       LUQ(cm)        LLQ(cm)  3             4.1           4.3            6.5  ---------------------------------------------------------------------- Biophysical Evaluation  Amniotic F.V:   Pocket => 2 cm             F. Tone:        Observed  F. Movement:    Observed                   Score:          8/8  F. Breathing:   Observed ---------------------------------------------------------------------- OB History  Gravidity:    3         Term:   2  Living:       2 ---------------------------------------------------------------------- Gestational Age  Best:  37w 5d     Det. By:  Previous Ultrasound      EDD:   10/08/20                                      (03/16/20) ---------------------------------------------------------------------- Anatomy  Ventricles:            Appears normal         Stomach:                Appears normal, left                                                                        sided  Heart:                 Appears normal         Kidneys:                Appear normal                         (4CH, axis, and                         situs)  Diaphragm:             Appears normal         Bladder:                Appears normal ---------------------------------------------------------------------- Cervix Uterus Adnexa  Cervix  Not visualized (advanced GA >24wks)  Uterus  No abnormality visualized.  Right Ovary  Within normal limits. No adnexal mass visualized.  Left Ovary  Within normal limits. No adnexal mass visualized.  Cul De Sac  No free fluid seen.  Adnexa  No abnormality visualized. ---------------------------------------------------------------------- Comments  This patient was seen for a biophysical profile due to insulin  controlled gestational diabetes.  She denies any problems  since her last exam.  A biophysical profile performed today was 8 out of 8.  There was normal amniotic fluid noted on today's ultrasound  exam.  Another biophysical profile was scheduled in 1 week.  The patient reports that she has an induction of labor already  scheduled on October 01, 2020. ----------------------------------------------------------------------                   Johnell Comings, MD Electronically Signed Final Report   09/22/2020 10:48 am ----------------------------------------------------------------------  [place] MFM FETAL BPP WO NON STRESS  Result Date: 09/15/2020 ----------------------------------------------------------------------  OBSTETRICS REPORT                       (Signed Final 09/15/2020 02:16 pm) ---------------------------------------------------------------------- Patient Info  ID #:       [id number]                          D.O.B.:  1981-01-01 (39 yrs)  Name:       ESMERELDA FINNIGAN              Visit Date: 09/15/2020 12:02 pm ---------------------------------------------------------------------- Performed By  Attending:  Corenthian Booker      Ref. Address:     Faculty                    MD  Performed By:     Claudia Desanctis,      [facility]                    RDMS,RDCS                                [facility]                                                             [place] [facility]                                                             [facility]  Referred By:      Vickii Chafe                    CONSTANT MD ---------------------------------------------------------------------- Orders  #  Description                           Code        Ordered By  1  [place] MFM FETAL BPP WO NON               76819.01    YU FANG     STRESS ----------------------------------------------------------------------  #  Order #                     Accession #                Episode #  1  [id number]                   [id number]                 [id number] ---------------------------------------------------------------------- Indications  Gestational diabetes in pregnancy, insulin     O24.414  controlled  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)  Poor obstetric history: Previous               O09.299  preeclampsia /  eclampsia/gestational HTN  Advanced maternal age multigravida 57+,        O76.523  third trimester  Obesity complicating pregnancy, third          O99.213  trimester  (BMI 33)  Uterine fibroids affecting pregnancy in third  O34.13, D25.9  trimester, antepartum  Encounter for other antenatal screening        Z36.2  follow-up  [redacted] weeks gestation of pregnancy                Z3A.36 ---------------------------------------------------------------------- Vital Signs  Height:        5'6" ---------------------------------------------------------------------- Fetal Evaluation  Num Of Fetuses:         1  Cardiac Activity:       Observed  Presentation:           Cephalic  Placenta:               Posterior  P. Cord Insertion:      Previously Visualized  Amniotic Fluid  AFI FV:      Within normal limits  AFI Sum(cm)     %Tile       Largest Pocket(cm)  15.85           59          5.57  RUQ(cm)       RLQ(cm)       LUQ(cm)        LLQ(cm)  2.5           4.04          3.74           5.57 ---------------------------------------------------------------------- Biophysical Evaluation  Amniotic F.V:   Pocket => 2 cm             F. Tone:        Observed  F. Movement:    Observed                   Score:          8/8  F. Breathing:   Observed ---------------------------------------------------------------------- OB History  Gravidity:    3         Term:   2  Living:       2 ---------------------------------------------------------------------- Gestational Age  Best:          36w 5d     Det. By:  Previous Ultrasound      EDD:   10/08/20                                      (03/16/20) ---------------------------------------------------------------------- Anatomy  Cranium:               Appears normal         LVOT:                   Appears normal  Ventricles:            Appears normal         Stomach:                Appears normal, left                                                                         sided  Choroid Plexus:        Appears normal         Kidneys:                Appear normal  Heart:                 Appears normal         Bladder:  Appears normal                         (4CH, axis, and                         situs)  RVOT:                  Appears normal  Other:  Other anatomy previously imaged and appeared normal. ---------------------------------------------------------------------- Impression  Antenatal testing performed given maternal A2GDM  The biophysical profile was 8/8 with good fetal movement and  amniotic fluid volume. ---------------------------------------------------------------------- Recommendations  Continue weekly testing  Delivery scheduled on 09/28/20 ----------------------------------------------------------------------               Sander Nephew, MD Electronically Signed Final Report   09/15/2020 02:16 pm ----------------------------------------------------------------------  [place] MFM OB FOLLOW UP  Result Date: 09/29/2020 ----------------------------------------------------------------------  OBSTETRICS REPORT                       (Signed Final 09/29/2020 12:10 pm) ---------------------------------------------------------------------- Patient Info  ID #:       [id number]                          D.O.B.:  14-Aug-1981 (39 yrs)  Name:       MARGRETE DELUDE              Visit Date: 09/29/2020 08:30 am ---------------------------------------------------------------------- Performed By  Attending:        Tama High MD        Ref. Address:     Faculty  Performed By:     Rodrigo Ran BS      [facility]                    RDMS RVT                                 [facility] at                                                             [place] [facility]                                                             [facility]  Referred By:      Vickii Chafe                    CONSTANT MD  ---------------------------------------------------------------------- Orders  #  Description                           Code        Ordered By  1  [place] MFM FETAL BPP WO NON               94496.75    YU FANG  STRESS  2  [place] MFM OB FOLLOW UP                   [id number]    YU FANG ----------------------------------------------------------------------  #  Order #                     Accession #                Episode #  1  [id number]                   [id number]                 [id number]  2  [id number]                   [id number]                 [id number] ---------------------------------------------------------------------- Indications  [redacted] weeks gestation of pregnancy                Z3A.38  Gestational diabetes in pregnancy, insulin     O24.414  controlled  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)  Poor obstetric history: Previous               O09.299  preeclampsia / eclampsia/gestational HTN  Advanced maternal age multigravida 33+,        O25.523  third trimester  Obesity complicating pregnancy, third          O99.213  trimester  (BMI 33)  Uterine fibroids affecting pregnancy in third  O34.13, D25.9  trimester, antepartum  Encounter for other antenatal screening        Z36.2  follow-up ---------------------------------------------------------------------- Vital Signs                                                 Height:        5'6" ---------------------------------------------------------------------- Fetal Evaluation  Num Of Fetuses:         1  Fetal Heart Rate(bpm):  147  Cardiac Activity:       Observed  Presentation:           Cephalic  Placenta:               Posterior  P. Cord Insertion:      Previously Visualized  Amniotic Fluid  AFI FV:      Within normal limits  AFI Sum(cm)     %Tile       Largest Pocket(cm)  12.6            48          3.9  RUQ(cm)       RLQ(cm)       LUQ(cm)        LLQ(cm)  3.9           3.1           1.7            3.9  ---------------------------------------------------------------------- Biophysical Evaluation  Amniotic F.V:   Within normal limits       F. Tone:        Observed  F. Movement:    Observed                   Score:  8/8  F. Breathing:   Observed ---------------------------------------------------------------------- Biometry  BPD:      92.9  mm     G. Age:  37w 5d         55  %    CI:         75.2   %    70 - 86                                                          FL/HC:      22.1   %    20.6 - 23.4  HC:      339.8  mm     G. Age:  39w 1d         44  %    HC/AC:      0.97        0.87 - 1.06  AC:      351.6  mm     G. Age:  39w 0d         79  %    FL/BPD:     80.9   %    71 - 87  FL:       75.2  mm     G. Age:  38w 3d         17  %    FL/AC:      21.4   %    20 - 24  HUM:      63.9  mm     G. Age:  37w 0d         44  %  Est. FW:    3589  gm    7 lb 15 oz      69  % ---------------------------------------------------------------------- OB History  Gravidity:    3         Term:   2  Living:       2 ---------------------------------------------------------------------- Gestational Age  U/S Today:     38w 4d                                        EDD:   10/09/20  Best:          38w 5d     Det. By:  Previous Ultrasound      EDD:   10/08/20                                      (03/16/20) ---------------------------------------------------------------------- Anatomy  Cranium:               Appears normal         Aortic Arch:            Previously seen  Cavum:                 Previously seen        Ductal Arch:            Previously seen  Ventricles:            Previously seen        Diaphragm:  Previously seen  Choroid Plexus:        Previously seen        Stomach:                Appears normal, left                                                                        sided  Cerebellum:            Previously seen        Abdomen:                Previously seen  Posterior Fossa:       Previously seen         Abdominal Wall:         Previously seen  Nuchal Fold:           Previously seen        Cord Vessels:           Previously seen  Face:                  Appears normal         Kidneys:                Appear normal                         (orbits and profile)  Lips:                  Appears normal         Bladder:                Appears normal  Thoracic:              Previously seen        Spine:                  Previously seen  Heart:                 Appears normal         Upper Extremities:      Previously seen                         (4CH, axis, and                         situs)  RVOT:                  Appears normal         Lower Extremities:      Previously seen  LVOT:                  Appears normal  Other:  Heels and 5th digit previously visualized. Technically difficult due to          advanced GA and fetal position. ---------------------------------------------------------------------- Cervix Uterus Adnexa  Cervix  Not visualized (advanced GA >24wks) ---------------------------------------------------------------------- Impression  Gestational diabetes. Patient takes insulin for control .  Fetal growth is appropriate for gestational age .Amniotic fluid  is normal and good fetal activity is  seen .Antenatal testing is  reassuring. BPP 8/8.  Patient will undergo induction of labor this week.  This study was remotely read . ----------------------------------------------------------------------                  Tama High, MD Electronically Signed Final Report   09/29/2020 12:10 pm ----------------------------------------------------------------------      Assessment and Plan:     1. Postpartum care following vaginal delivery  2. Constipation by delayed colonic transit - pain relieved after BM - usual measures recommended to increase fluids, fiber and take Miralax, Colace prn  3. Hemorrhoids during puerperium  - Anusol recommended prn - avoid constipation with above measures    I discussed  the assessment and treatment plan with the patient. The patient was provided an opportunity to ask questions and all were answered. The patient agreed with the plan and demonstrated an understanding of the instructions.   The patient was advised to call back or seek an in-person evaluation/go to the ED if the symptoms worsen or if the condition fails to improve as anticipated.  I provided 15 minutes of face-to-face time during this encounter.   Baltazar Najjar, MD [facility], [facility] 10/11/20

## 2020-10-11 NOTE — Telephone Encounter (Signed)
Patient left a message with on-call provider regarding  abdominal pain she has been having since delivery on 10/02/2020. No answer

## 2020-11-15 ENCOUNTER — Encounter: Payer: Self-pay | Admitting: Obstetrics and Gynecology

## 2020-11-15 ENCOUNTER — Other Ambulatory Visit: Payer: Self-pay

## 2020-11-15 ENCOUNTER — Ambulatory Visit (INDEPENDENT_AMBULATORY_CARE_PROVIDER_SITE_OTHER): Payer: 59 | Admitting: Obstetrics and Gynecology

## 2020-11-15 ENCOUNTER — Other Ambulatory Visit: Payer: 59

## 2020-11-15 DIAGNOSIS — E119 Type 2 diabetes mellitus without complications: Secondary | ICD-10-CM

## 2020-11-15 DIAGNOSIS — O2413 Pre-existing diabetes mellitus, type 2, in the puerperium: Secondary | ICD-10-CM

## 2020-11-15 DIAGNOSIS — O24913 Unspecified diabetes mellitus in pregnancy, third trimester: Secondary | ICD-10-CM

## 2020-11-15 NOTE — Progress Notes (Signed)
[facility] Partum Visit Note  Diane Snyder is a 39 y.o. [id number] female who presents for a postpartum visit. She is 6 weeks postpartum following a normal spontaneous vaginal delivery.  I have fully reviewed the prenatal and intrapartum course. The delivery was at 62 gestational weeks.  Anesthesia: epidural. Postpartum course has been unremarkable. Baby is doing well Baby is feeding by bottle - gerber Diane Snyder /Pumping . Bleeding no bleeding. Bowel function is normal. Bladder function is normal. Patient is not sexually active. Contraception method is none. Postpartum depression screening: negative.   The pregnancy intention screening data noted above was reviewed. Potential methods of contraception were discussed. The patient elected to proceed with Female Condom.      The following portions of the patient's history were reviewed and updated as appropriate:  She  has a past medical history of Diverticulosis, GDM (gestational diabetes mellitus) (03/2020), Gestational diabetes, and History of gestational hypertension. She does not have any pertinent problems on file. She  has a past surgical history that includes Cholecystectomy. Her family history includes Diabetes in her maternal grandmother; Heart attack in her maternal grandfather; Heart disease in her maternal grandfather and maternal grandmother; Hypertension in her maternal grandmother and mother; Stroke in her maternal grandfather. She  reports that she has quit smoking. She has never used smokeless tobacco. She reports previous alcohol use. She reports that she does not use drugs. She has a current medication list which includes the following prescription(s): acetaminophen, blood pressure kit, coconut oil, ibuprofen, and prenatal mv-min-fa-omega-3. Current Outpatient Medications on File Prior to Visit  Medication Sig Dispense Refill  . acetaminophen (TYLENOL) 325 MG tablet Take 2 tablets (650 mg total) by mouth every 6 (six) hours. (Patient  not taking: No sig reported)    . Blood Pressure Monitoring (BLOOD PRESSURE KIT) DEVI 1 kit by Does not apply route once a week. Check Blood Pressure regularly and record readings into the Babyscripts App.  Large Cuff.  DX O90.0 (Patient not taking: No sig reported) 1 each 0  . coconut oil OIL Apply 1 application topically as needed. (Patient not taking: No sig reported)  0  . ibuprofen (ADVIL) 600 MG tablet Take 1 tablet (600 mg total) by mouth every 6 (six) hours. (Patient not taking: No sig reported) 30 tablet 0  . Prenatal MV-Min-FA-Omega-3 (PRENATAL GUMMIES/DHA & FA PO) Take 2 tablets by mouth daily.  (Patient not taking: Reported on 11/15/2020)     No current facility-administered medications on file prior to visit.   She is allergic to contrast media [iodinated diagnostic agents]..  Review of Systems A comprehensive review of systems was negative.    Objective:  LMP 02/01/2020 Comment: spotting only   General:  alert and cooperative   Breasts:  inspection negative, no nipple discharge or bleeding, no masses or nodularity palpable  Lungs: clear to auscultation bilaterally  Heart:  regular rate and rhythm, S1, S2 normal, no murmur, click, rub or gallop  Abdomen: soft, non-tender; bowel sounds normal; no masses,  no organomegaly   Vulva:  not evaluated  Vagina: not evaluated  Cervix:  not evaluated  Corpus: not examined  Adnexa:  not evaluated  Rectal Exam: Not performed.        Assessment:    Normal postpartum exam. Pap smear not done at today's visit.    A2Gest DM, not on insulin since delivery.  Plan:   Essential components of care per ACOG recommendations:  1.  Mood and well being: Patient  with negative depression screening today. Reviewed local resources for support.  - Patient does not use tobacco. - hx of drug use? No   2. Infant care and feeding:  -Patient currently breastmilk feeding? Yes If breastmilk feeding discussed return to work and pumping. If needed,  patient was provided letter for work to allow for every 2-3 hr pumping breaks, and to be granted a private location to express breastmilk and refrigerated area to store breastmilk. Reviewed importance of draining breast regularly to support lactation. -Social determinants of health (SDOH) reviewed in EPIC. No concerns  3. Sexuality, contraception and birth spacing - Patient does not want a pregnancy in the next year.  Desired family size is unknown number of children.  - Reviewed forms of contraception in tiered fashion. Patient desired condoms today.   - Discussed birth spacing of 18 months  4. Sleep and fatigue -Encouraged family/partner/community support of 4 hrs of uninterrupted sleep to help with mood and fatigue  5. Physical Recovery  - Discussed patients delivery and gestational diabetes  - Patient had a no degree laceration, - Patient has urinary incontinence? No - Patient is safe to resume physical and sexual activity  6.  Health Maintenance - Last pap smear done 02/14/20 and was normal with negative HPV. No Mammogram  7. A2Gest DM, likely Type 2 DM secondary to HbALC of 6.4 - Patient for 2hr GTT in 2 days. - PCP follow up if elevated.    [facility]

## 2020-11-16 ENCOUNTER — Other Ambulatory Visit: Payer: 59

## 2020-11-17 ENCOUNTER — Other Ambulatory Visit: Payer: Self-pay

## 2020-11-17 ENCOUNTER — Other Ambulatory Visit: Payer: 59

## 2020-11-17 DIAGNOSIS — Z8759 Personal history of other complications of pregnancy, childbirth and the puerperium: Secondary | ICD-10-CM

## 2020-11-18 LAB — GLUCOSE TOLERANCE, 2 HOURS
Glucose, 2 hour: 107 mg/dL (ref 65–139)
Glucose, GTT - Fasting: 84 mg/dL (ref 65–99)

## 2021-07-13 ENCOUNTER — Ambulatory Visit: Payer: 59 | Admitting: Obstetrics

## 2021-07-14 ENCOUNTER — Other Ambulatory Visit: Payer: Self-pay

## 2021-07-14 ENCOUNTER — Ambulatory Visit (INDEPENDENT_AMBULATORY_CARE_PROVIDER_SITE_OTHER): Payer: 59 | Admitting: Obstetrics

## 2021-07-14 ENCOUNTER — Encounter: Payer: Self-pay | Admitting: Obstetrics

## 2021-07-14 VITALS — BP 136/90 | HR 77 | Wt 199.0 lb

## 2021-07-14 DIAGNOSIS — Z308 Encounter for other contraceptive management: Secondary | ICD-10-CM

## 2021-07-14 DIAGNOSIS — Z30430 Encounter for insertion of intrauterine contraceptive device: Secondary | ICD-10-CM

## 2021-07-14 NOTE — Progress Notes (Signed)
Pt is in the office for IUD insertion. LMP 07-07-21, last intercourse Monday

## 2021-07-14 NOTE — Progress Notes (Signed)
Patient presents for IUD Insertion.  Last unprotected intercourse was 4 days ago, and she is mid-cycle.  Explained office policies regarding IUD Insertion and the requirements for abstinence for at least 2 weeks prior to insertion, and preferably insertion during menses.  She expressed an understanding of the office policy and agreed to reschedule appointment for IUD Insertion during her next menses.  Shelly Bombard, MD 07/14/2021 11:36 AM

## 2021-08-21 IMAGING — US US MFM FETAL BPP W/O NON-STRESS
1 series · 13 of 28 positions shown · non-contrast
Comparison: none

[Series 1: us mfm fetal bpp w/o non-stress · 49 acquisitions, 13 frames shown]
[im 2/49]
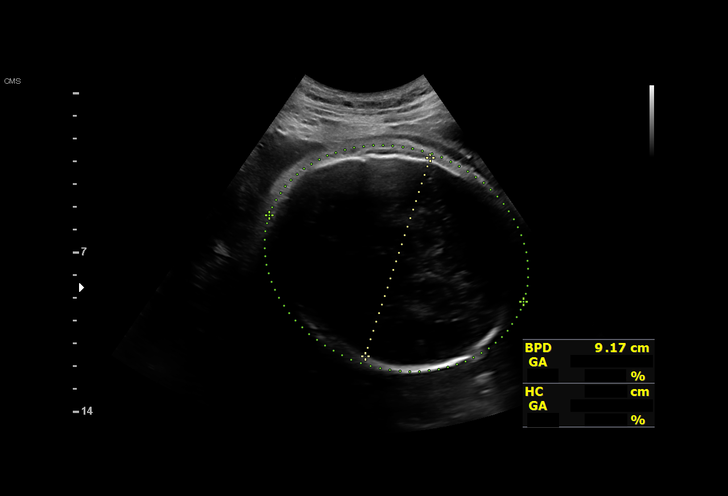
[im 6/49]
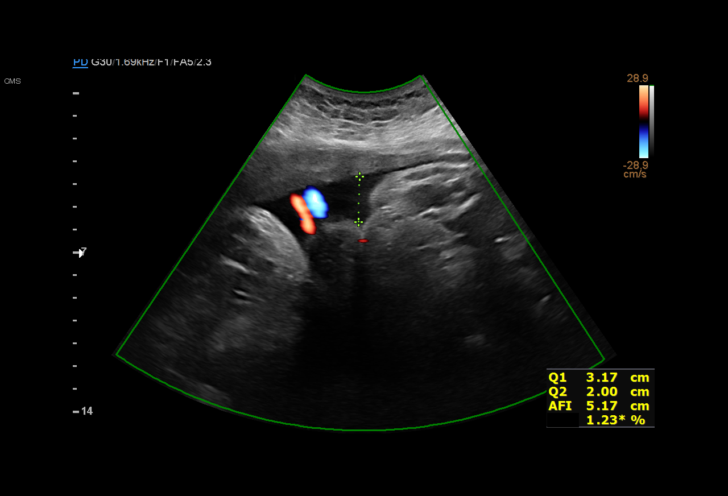
[im 9/49]
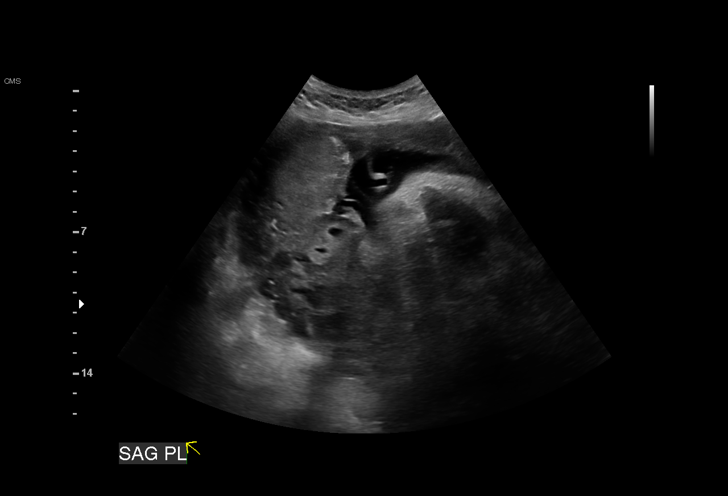
[im 13/49]
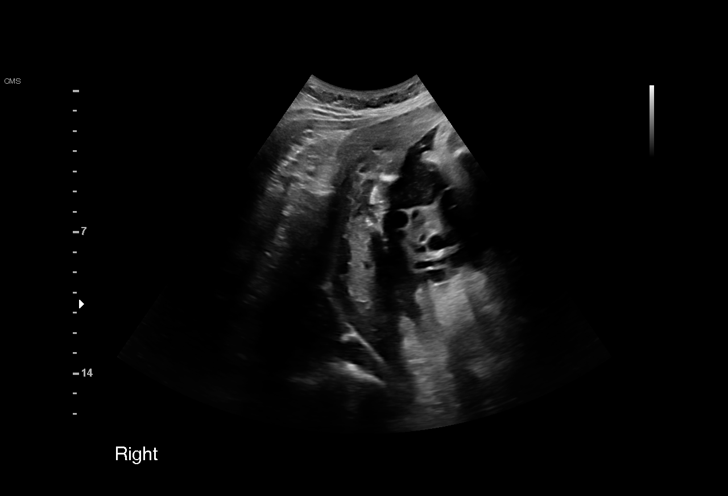
[im 17/49]
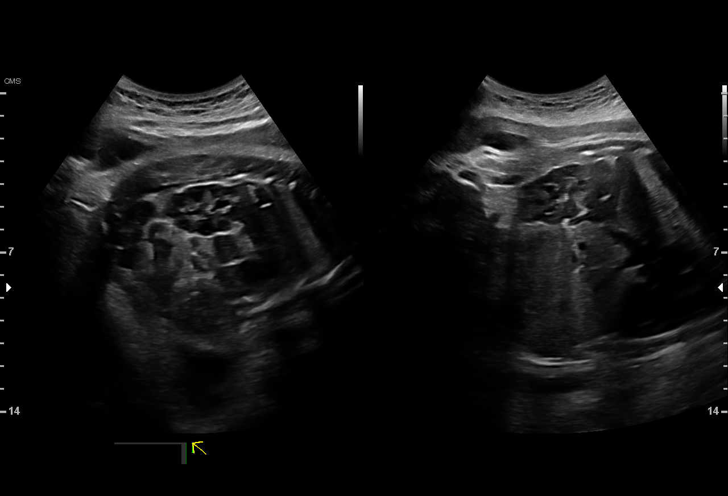
[im 20/49]
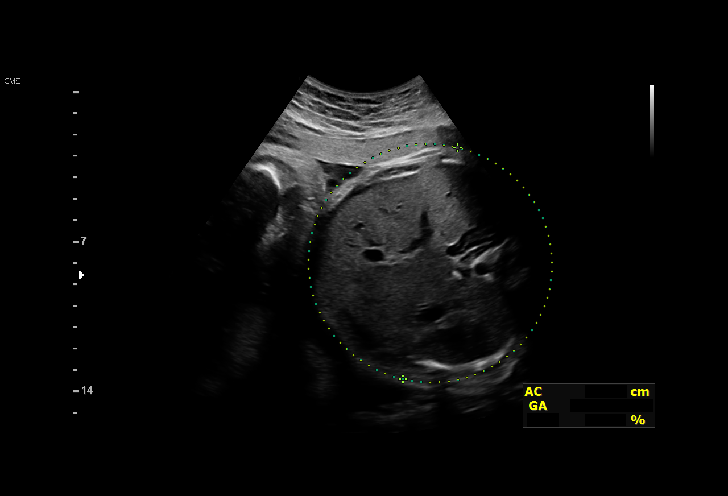
[im 25/49]
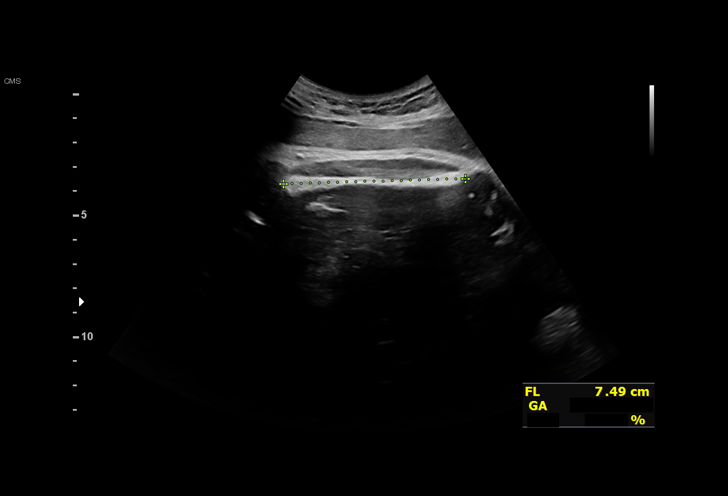
[im 29/49]
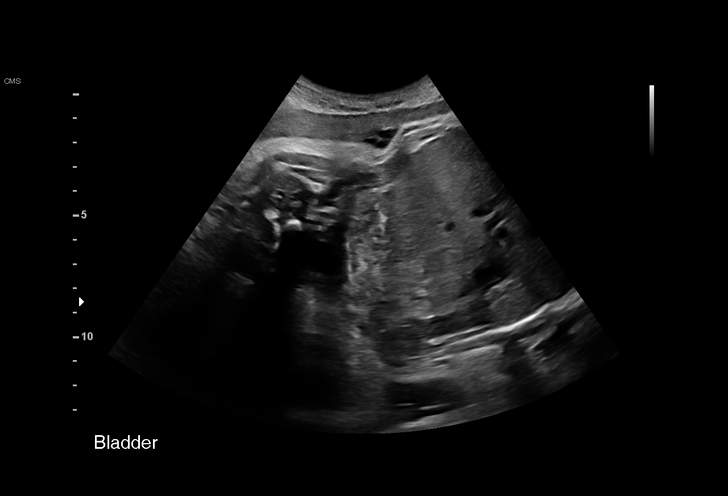
[im 33/49]
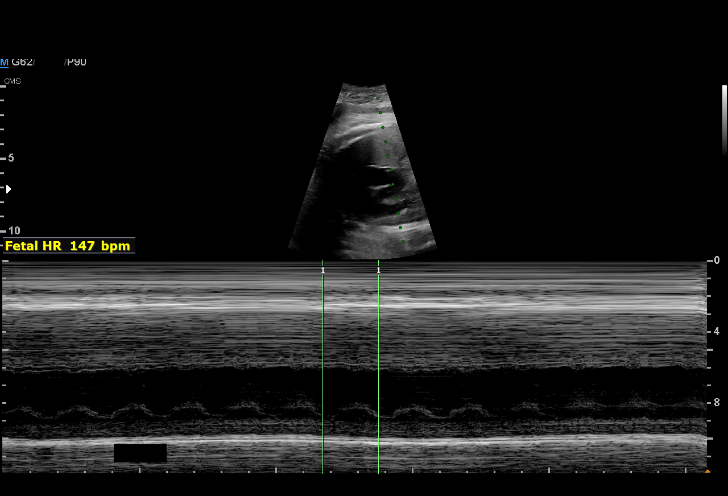
[im 36/49]
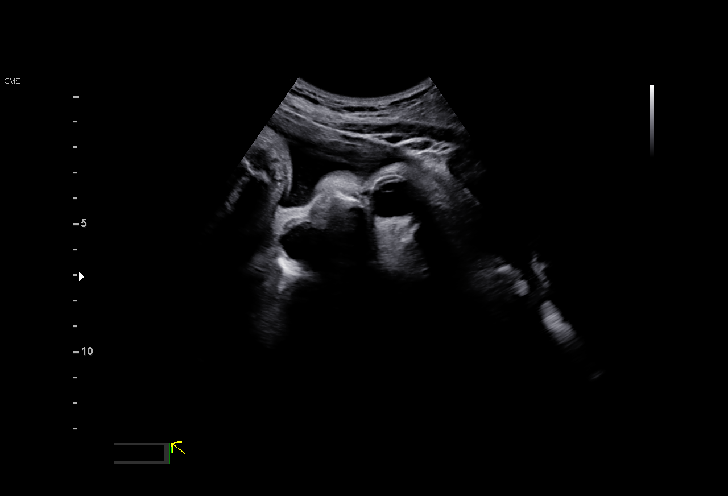
[im 40/49]
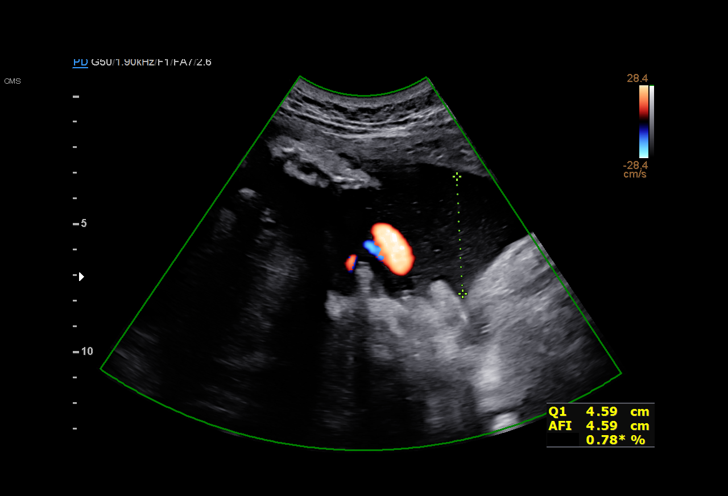
[im 43/49]
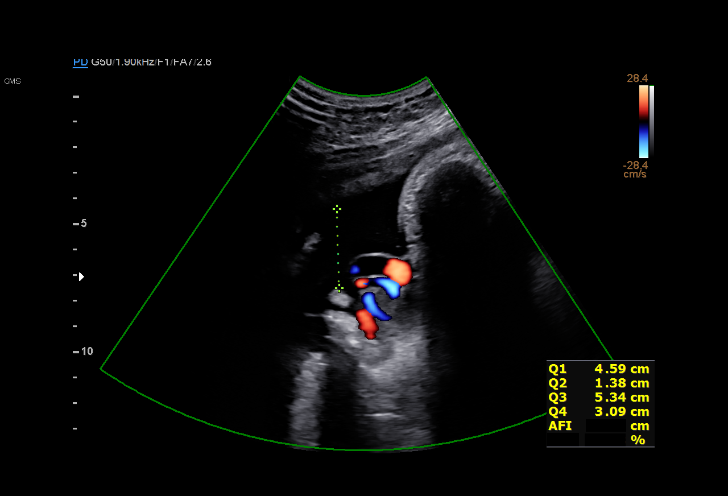
[im 47/49]
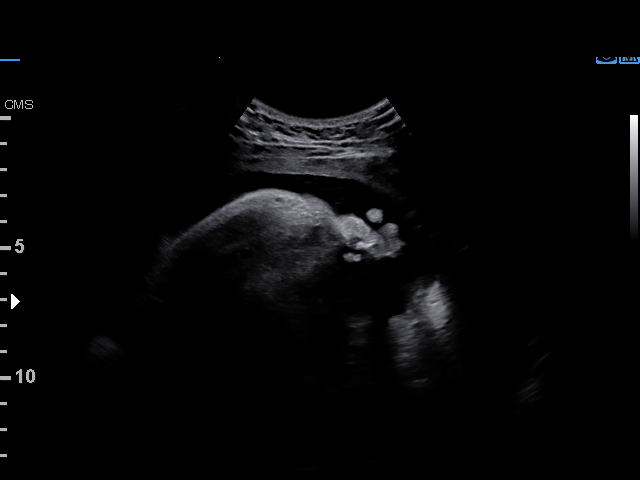

[13 of 28 positions shown; findings below may reference images not displayed]

Indications

 38 weeks gestation of pregnancy
 Gestational diabetes in pregnancy, insulin
 controlled
 Genetic carrier (silent carrier for Pranas Slyzius &
 Gaucher disease)
 Poor obstetric history: Previous
 preeclampsia / eclampsia/gestational HTN
 Advanced maternal age multigravida 35+,
 third trimester
 Obesity complicating pregnancy, third
 trimester  (BMI 33)
 Uterine fibroids affecting pregnancy in third  O34.13,
 trimester, antepartum
 Encounter for other antenatal screening
 follow-up
Vital Signs

                                                Height:        5'6"
Fetal Evaluation

 Num Of Fetuses:         1
 Fetal Heart Rate(bpm):  147
 Cardiac Activity:       Observed
 Presentation:           Cephalic
 Placenta:               Posterior
 P. Cord Insertion:      Previously Visualized
 Amniotic Fluid
 AFI FV:      Within normal limits

 AFI Sum(cm)     %Tile       Largest Pocket(cm)
 12.6            48

 RUQ(cm)       RLQ(cm)       LUQ(cm)        LLQ(cm)

Biophysical Evaluation

 Amniotic F.V:   Within normal limits       F. Tone:        Observed
 F. Movement:    Observed                   Score:          [DATE]
 F. Breathing:   Observed
Biometry

 BPD:      92.9  mm     G. Age:  37w 5d         55  %    CI:         75.2   %    70 - 86
                                                         FL/HC:      22.1   %    20.6 -
 HC:      339.8  mm     G. Age:  39w 1d         44  %    HC/AC:      0.97        0.87 -
 AC:      351.6  mm     G. Age:  39w 0d         79  %    FL/BPD:     80.9   %    71 - 87
 FL:       75.2  mm     G. Age:  38w 3d         49  %    FL/AC:      21.4   %    20 - 24
 HUM:      63.9  mm     G. Age:  37w 0d         44  %

 Est. FW:    9769  gm    7 lb 15 oz      69  %
OB History

 Gravidity:    3         Term:   2
 Living:       2
Gestational Age

 U/S Today:     38w 4d                                        EDD:   10/09/20
 Best:          38w 5d     Det. By:  Previous Ultrasound      EDD:   10/08/20
                                     (03/16/20)
Anatomy

 Cranium:               Appears normal         Aortic Arch:            Previously seen
 Cavum:                 Previously seen        Ductal Arch:            Previously seen
 Ventricles:            Previously seen        Diaphragm:              Previously seen
 Choroid Plexus:        Previously seen        Stomach:                Appears normal, left
                                                                       sided
 Cerebellum:            Previously seen        Abdomen:                Previously seen
 Posterior Fossa:       Previously seen        Abdominal Wall:         Previously seen
 Nuchal Fold:           Previously seen        Cord Vessels:           Previously seen
 Face:                  Appears normal         Kidneys:                Appear normal
                        (orbits and profile)
 Lips:                  Appears normal         Bladder:                Appears normal
 Thoracic:              Previously seen        Spine:                  Previously seen
 Heart:                 Appears normal         Upper Extremities:      Previously seen
                        (4CH, axis, and
                        situs)
 RVOT:                  Appears normal         Lower Extremities:      Previously seen
 LVOT:                  Appears normal

 Other:  Heels and 5th digit previously visualized. Technically difficult due to
         advanced GA and fetal position.
Cervix Uterus Adnexa

 Cervix
 Not visualized (advanced GA >97wks)
Impression

 Gestational diabetes. Patient takes insulin for control .
 Fetal growth is appropriate for gestational age .Amniotic fluid
 is normal and good fetal activity is seen .Antenatal testing is
 reassuring. BPP [DATE].

 Patient will undergo induction of labor this week.

 This study was remotely read .
                 Jumper, [place]

## 2021-10-24 ENCOUNTER — Other Ambulatory Visit: Payer: Self-pay

## 2021-10-24 ENCOUNTER — Ambulatory Visit (INDEPENDENT_AMBULATORY_CARE_PROVIDER_SITE_OTHER): Payer: 59 | Admitting: Family Medicine

## 2021-10-24 ENCOUNTER — Encounter: Payer: Self-pay | Admitting: Family Medicine

## 2021-10-24 VITALS — BP 120/76 | HR 85 | Ht 66.0 in | Wt 205.0 lb

## 2021-10-24 DIAGNOSIS — Z30430 Encounter for insertion of intrauterine contraceptive device: Secondary | ICD-10-CM

## 2021-10-24 DIAGNOSIS — Z01812 Encounter for preprocedural laboratory examination: Secondary | ICD-10-CM | POA: Diagnosis not present

## 2021-10-24 DIAGNOSIS — Z3009 Encounter for other general counseling and advice on contraception: Secondary | ICD-10-CM

## 2021-10-24 LAB — POCT URINE PREGNANCY: Preg Test, Ur: NEGATIVE

## 2021-10-24 MED ORDER — LEVONORGESTREL 20 MCG/DAY IU IUD
1.0000 | INTRAUTERINE_SYSTEM | Freq: Once | INTRAUTERINE | Status: AC
Start: 2021-10-24 — End: 2021-10-24
  Administered 2021-10-24: 1 via INTRAUTERINE

## 2021-10-24 NOTE — Progress Notes (Addendum)
GYN presents for IUD Mirena insertion, last unprotected sex was 2 weeks ago.  LMP 10/20/2021  UPT today is NEGATIVE  Administrations This Visit     levonorgestrel (MIRENA) 20 MCG/DAY IUD 1 each     Admin Date 10/24/2021 Action Given Dose 1 each Route Intrauterine Administered By Tamela Oddi, RMA

## 2021-10-24 NOTE — Progress Notes (Signed)
    GYNECOLOGY OFFICE PROCEDURE NOTE  Diane Snyder is a 40 y.o. [phone] here for Mirena IUD insertion. No GYN concerns.  Last pap smear was on 02/03/2020 and was normal.  IUD Insertion Procedure Note Patient identified, informed consent performed, consent signed.   Discussed risks of irregular bleeding, cramping, infection, malpositioning or misplacement of the IUD outside the uterus which may require further procedure such as laparoscopy. Also discussed >99% contraception efficacy, increased risk of ectopic pregnancy with failure of method.   Emphasized that this did not protect against STIs, condoms recommended during all sexual encounters. Time out was performed.  Urine pregnancy test negative.  Speculum placed in the vagina.  Cervix visualized.  Cleaned with Betadine x 2.  Grasped anteriorly with a single tooth tenaculum.  Uterus sounded to 7 cm.  Mirena IUD placed per manufacturer's recommendations.  Strings trimmed to 3 cm. Tenaculum was removed, and pressure held with fox swab at tenaculum site. Good hemostasis noted.  Patient tolerated procedure well.   Patient was given post-procedure instructions.  She was advised to have backup contraception for one week.  Patient was also asked to check IUD strings periodically and follow up in 4 weeks for IUD check.  Mirena Lot # [id number] Exp: 2025/Jan S/N [id number]  2. Encounter for counseling regarding initiation of other contraceptive measure Patient presented for Mirena IUD. Would like Mirena IUD placed after discussion of risks and benefits.  - POCT urine pregnancy   Routine preventative health maintenance measures emphasized. Please refer to After Visit Summary for other counseling recommendations.   Return in about 6 weeks (around 12/05/2021) for IUD string check.   Total face-to-face time with patient: 25 minutes.  Over 50% of encounter was spent on counseling and coordination of care.  Renard Matter, MD, MPH OB Fellow, [place] for [facility], [place]

## 2021-12-06 ENCOUNTER — Ambulatory Visit: Payer: 59 | Admitting: Obstetrics and Gynecology

## 2022-12-24 ENCOUNTER — Encounter: Payer: Self-pay | Admitting: Family Medicine

## 2022-12-24 ENCOUNTER — Ambulatory Visit (INDEPENDENT_AMBULATORY_CARE_PROVIDER_SITE_OTHER): Payer: Medicaid Other | Admitting: Family Medicine

## 2022-12-24 VITALS — BP 120/80 | HR 84 | Temp 98.1°F | Ht 66.0 in | Wt 205.6 lb

## 2022-12-24 DIAGNOSIS — Z7689 Persons encountering health services in other specified circumstances: Secondary | ICD-10-CM | POA: Diagnosis not present

## 2022-12-24 DIAGNOSIS — Z13220 Encounter for screening for lipoid disorders: Secondary | ICD-10-CM | POA: Diagnosis not present

## 2022-12-24 DIAGNOSIS — Z136 Encounter for screening for cardiovascular disorders: Secondary | ICD-10-CM | POA: Diagnosis not present

## 2022-12-24 DIAGNOSIS — R7302 Impaired glucose tolerance (oral): Secondary | ICD-10-CM | POA: Diagnosis not present

## 2022-12-24 DIAGNOSIS — Z8632 Personal history of gestational diabetes: Secondary | ICD-10-CM

## 2022-12-24 NOTE — Progress Notes (Signed)
New Patient Office Visit  Subjective    Patient ID: Diane Snyder, female    DOB: 1981/04/25  Age: 42 y.o. MRN: [id number]  CC:  Chief Complaint  Patient presents with   Blurred Vision    Last week on Tuesday when waking up she describes her vision as "spinning".  She sat back down for a minute to re gather herself and her belly started hurting.     HPI Diane Snyder presents to establish care  Pt reports last week as she was getting up and noticed the vent on her ceiling was shaking. She says her vision was distorted and felt like she was on a boat. No headaches, chest pains, or SOB. She has hx of gestational diabetes with her son who is 8 years old now. She use to take Metformin but was switched to Insulin towards the end of her pregnancy. She hasn't been on any medicines for the last year. She denies URI symptoms. She has not had an eye exam in many years.   Outpatient Encounter Medications as of 12/24/2022  Medication Sig   [DISCONTINUED] acetaminophen (TYLENOL) 325 MG tablet Take 2 tablets (650 mg total) by mouth every 6 (six) hours. (Patient not taking: Reported on 10/11/2020)   [DISCONTINUED] Blood Pressure Monitoring (BLOOD PRESSURE KIT) DEVI 1 kit by Does not apply route once a week. Check Blood Pressure regularly and record readings into the Babyscripts App.  Large Cuff.  DX O90.0 (Patient not taking: Reported on 10/11/2020)   [DISCONTINUED] coconut oil OIL Apply 1 application topically as needed. (Patient not taking: Reported on 10/11/2020)   [DISCONTINUED] ibuprofen (ADVIL) 600 MG tablet Take 1 tablet (600 mg total) by mouth every 6 (six) hours. (Patient not taking: Reported on 10/11/2020)   [DISCONTINUED] Prenatal MV-Min-FA-Omega-3 (PRENATAL GUMMIES/DHA & FA PO) Take 2 tablets by mouth daily.  (Patient not taking: Reported on 11/15/2020)   No facility-administered encounter medications on file as of 12/24/2022.    Past Medical History:  Diagnosis Date    Diverticulosis    GDM (gestational diabetes mellitus) 03/2020   History of gestational hypertension     Past Surgical History:  Procedure Laterality Date   CHOLECYSTECTOMY      Family History  Problem Relation Age of Onset   Hypertension Mother    Heart disease Maternal Grandmother    Hypertension Maternal Grandmother    Diabetes Maternal Grandmother    Stroke Maternal Grandfather    Heart disease Maternal Grandfather    Heart attack Maternal Grandfather     Social History   Socioeconomic History   Marital status: Single    Spouse name: Not on file   Number of children: Not on file   Years of education: Not on file   Highest education level: Not on file  Occupational History   Not on file  Tobacco Use   Smoking status: Former   Smokeless tobacco: Never  Vaping Use   Vaping Use: Never used  Substance and Sexual Activity   Alcohol use: Not Currently   Drug use: No   Sexual activity: Yes    Birth control/protection: None  Other Topics Concern   Not on file  Social History Narrative   Not on file   Social Determinants of Health   Financial Resource Strain: Not on file  Food Insecurity: Not on file  Transportation Needs: Not on file  Physical Activity: Not on file  Stress: Not on file  Social Connections: Not on file  Intimate Partner Violence: Not on file    Review of Systems  Eyes:  Positive for blurred vision.  Neurological:  Negative for dizziness and headaches.  All other systems reviewed and are negative.       Objective    BP 120/80   Pulse 84   Temp 98.1 F (36.7 C) (Oral)   Ht '5\' 6"'$  (1.676 m)   Wt 205 lb 9.6 oz (93.3 kg)   LMP  (LMP Unknown) Comment: Placed 2021  SpO2 98%   Breastfeeding No   BMI 33.18 kg/m   Physical Exam Vitals and nursing note reviewed.  Constitutional:      Appearance: Normal appearance. She is normal weight.  HENT:     Head: Normocephalic and atraumatic.     Right Ear: Tympanic membrane, ear canal and  external ear normal.     Left Ear: Tympanic membrane, ear canal and external ear normal.     Nose: Nose normal.     Mouth/Throat:     Mouth: Mucous membranes are moist.     Pharynx: Oropharynx is clear.  Eyes:     Conjunctiva/sclera: Conjunctivae normal.     Pupils: Pupils are equal, round, and reactive to light.  Cardiovascular:     Rate and Rhythm: Normal rate and regular rhythm.     Pulses: Normal pulses.     Heart sounds: Normal heart sounds.  Pulmonary:     Effort: Pulmonary effort is normal.     Breath sounds: Normal breath sounds.  Abdominal:     General: Abdomen is flat. Bowel sounds are normal.  Musculoskeletal:        General: Normal range of motion.  Skin:    General: Skin is warm.     Capillary Refill: Capillary refill takes less than 2 seconds.  Neurological:     General: No focal deficit present.     Mental Status: She is alert and oriented to person, place, and time. Mental status is at baseline.  Psychiatric:        Mood and Affect: Mood normal.        Behavior: Behavior normal.        Thought Content: Thought content normal.        Judgment: Judgment normal.        Assessment & Plan:   Problem List Items Addressed This Visit   None Visit Diagnoses     Encounter to establish care with new doctor    -  Primary   History of gestational diabetes       Encounter for lipid screening for cardiovascular disease       Relevant Orders   Lipid panel   Impaired glucose tolerance       Relevant Orders   CBC with Differential/Platelet   Comprehensive metabolic panel   Hemoglobin A1c      Pt with hx of gestational diabetes who presents with visual disturbances. Will screen for diabetes along with other metabolic labs including kidney function. Also has hx of gestational HTN but blood pressure good and at goal today.  Will follow up pending labs. Also advised for thorough eye exam with optometrist.  Return in about 3 months (around 03/25/2023) for Chronic  condition follow up.   Leeanne Rio, MD

## 2022-12-25 LAB — LIPID PANEL
Chol/HDL Ratio: 3.3 ratio (ref 0.0–4.4)
Cholesterol, Total: 166 mg/dL (ref 100–199)
HDL: 50 mg/dL (ref >39)
LDL Chol Calc (NIH): 106 mg/dL — ABNORMAL HIGH (ref 0–99)
Triglycerides: 51 mg/dL (ref 0–149)
VLDL Cholesterol Cal: 10 mg/dL (ref 5–40)

## 2022-12-25 LAB — COMPREHENSIVE METABOLIC PANEL
ALT: 10 IU/L (ref 0–32)
AST: 13 IU/L (ref 0–40)
Albumin/Globulin Ratio: 1.6 (ref 1.2–2.2)
Albumin: 4.3 g/dL (ref 3.9–4.9)
Alkaline Phosphatase: 89 IU/L (ref 44–121)
BUN/Creatinine Ratio: 11 (ref 9–23)
BUN: 8 mg/dL (ref 6–20)
Bilirubin Total: 0.5 mg/dL (ref 0.0–1.2)
CO2: 21 mmol/L (ref 20–29)
Calcium: 9.2 mg/dL (ref 8.7–10.2)
Chloride: 105 mmol/L (ref 96–106)
Creatinine, Ser: 0.73 mg/dL (ref 0.57–1.00)
Globulin, Total: 2.7 g/dL (ref 1.5–4.5)
Glucose: 98 mg/dL (ref 70–99)
Potassium: 4.5 mmol/L (ref 3.5–5.2)
Sodium: 141 mmol/L (ref 134–144)
Total Protein: 7 g/dL (ref 6.0–8.5)
eGFR: 107 mL/min/{1.73_m2} (ref >59)

## 2022-12-25 LAB — CBC WITH DIFFERENTIAL/PLATELET
Basophils Absolute: 0 10*3/uL (ref 0.0–0.2)
Basos: 0 %
EOS (ABSOLUTE): 0.2 10*3/uL (ref 0.0–0.4)
Eos: 4 %
Hematocrit: 37.6 % (ref 34.0–46.6)
Hemoglobin: 12.1 g/dL (ref 11.1–15.9)
Immature Grans (Abs): 0 10*3/uL (ref 0.0–0.1)
Immature Granulocytes: 0 %
Lymphocytes Absolute: 1.8 10*3/uL (ref 0.7–3.1)
Lymphs: 26 %
MCH: 25.4 pg — ABNORMAL LOW (ref 26.6–33.0)
MCHC: 32.2 g/dL (ref 31.5–35.7)
MCV: 79 fL (ref 79–97)
Monocytes Absolute: 0.4 10*3/uL (ref 0.1–0.9)
Monocytes: 6 %
Neutrophils Absolute: 4.4 10*3/uL (ref 1.4–7.0)
Neutrophils: 64 %
Platelets: 269 10*3/uL (ref 150–450)
RBC: 4.76 x10E6/uL (ref 3.77–5.28)
RDW: 13.2 % (ref 11.7–15.4)
WBC: 6.9 10*3/uL (ref 3.4–10.8)

## 2022-12-25 LAB — HEMOGLOBIN A1C
Est. average glucose Bld gHb Est-mCnc: 137 mg/dL
Hgb A1c MFr Bld: 6.4 % — ABNORMAL HIGH (ref 4.8–5.6)

## 2023-02-22 ENCOUNTER — Telehealth: Payer: Medicaid Other | Admitting: Family Medicine

## 2023-02-22 DIAGNOSIS — M542 Cervicalgia: Secondary | ICD-10-CM

## 2023-02-22 MED ORDER — NAPROXEN 500 MG PO TABS: 500.0000 mg | ORAL_TABLET | Freq: Two times a day (BID) | ORAL | 0 refills | Status: DC

## 2023-02-22 MED ORDER — CYCLOBENZAPRINE HCL 10 MG PO TABS: 10.0000 mg | ORAL_TABLET | Freq: Three times a day (TID) | ORAL | 0 refills | Status: DC | PRN

## 2023-02-22 NOTE — Patient Instructions (Signed)
neckCervical Sprain A cervical sprain is a stretch or tear in one or more of the ligaments in the neck. Ligaments are the tissues that connect bones to each other. Cervical sprains can range from mild to severe. Severe cervical sprains can cause the spinal bones (vertebrae) in the neck to be unstable. This can result in spinal cord damage and serious nervous system problems. Healing time for a cervical sprain depends on the cause and extent of the injury. Most cervical sprains heal in 4-6 weeks. What are the causes? Cervical sprains may be caused by trauma, such as an injury from a motor vehicle accident, a fall, or a sudden forward and backward whipping movement of the head and neck (whiplash injury). Mild cervical sprains may be caused by wear and tear over time. What increases the risk? You are more likely to get a cervical sprain if: You take part in activities that have a high risk of trauma to the neck. These include contact sports, gymnastics, and diving. You have: Osteoarthritis of the spine. Poor strength and flexibility of the neck. Poor posture. You have had a neck injury in the past. You spend long periods in positions that put stress on the neck, such as sitting at a computer. What are the signs or symptoms? Symptoms of this condition include: Any of these problems in the neck, shoulders, or upper back: Pain or tenderness. Stiffness. Swelling. A burning feeling. Sudden tightening of neck muscles (spasms). Limited ability to move the neck. Headache. Dizziness. Nausea or vomiting. Weakness, numbness, or tingling in a hand or an arm. Symptoms may develop right away after injury or may develop over a few days. In some cases, symptoms may go away with treatment and return (recur) over time. How is this diagnosed? This condition may be diagnosed based on: Your symptoms, medical history, and a physical exam. Any recent injuries or known neck problems that you have, such as  arthritis in the neck. Imaging tests, such as X-rays, an MRI, or a CT scan. How is this treated? This condition is treated by resting and icing the injured area and doing physical therapy exercises to improve movement and strength. Heat therapy may be used 2-3 days after the injury if there is no swelling. Depending on the severity of your condition, treatment may also include: Keeping your neck in place (immobilized) for periods of time. This may be done using: A cervical collar. This supports your chin and the back of your head. A cervical traction device. This is a sling that holds up your head. It removes weight and pressure from your neck. Medicines for pain or other symptoms. Surgery. This is rare. Follow these instructions at home: Medicines Take over-the-counter and prescription medicines only as told by your health care provider. Ask your provider if the medicine prescribed to you: Requires you to avoid driving or using machinery. Can cause constipation. You may need to take these actions to prevent or treat constipation: Drink enough fluid to keep your pee pale yellow. Take over-the-counter or prescription medicines. Eat foods that are high in fiber, such as beans, whole grains, and fresh fruits and vegetables. Limit foods that are high in fat and processed sugars, such as fried or sweet foods. If you have a cervical collar: Wear the collar as told by your provider. Do not remove it unless told. Ask before making any adjustments to your collar. If you have long hair, keep it outside of the collar. If you are allowed to remove the  collar for cleaning and bathing: Follow instructions about how to remove it safely. Clean it by hand with mild soap and water and air-dry it completely. If your collar has removable pads, remove them every 1-2 days and wash them by hand with soap and water. Let them air-dry completely before putting them back in the collar. Tell your provider if your  skin under the collar has irritation or sores. Managing pain, stiffness, and swelling     Use a cervical traction device as told. If told, put ice on the affected area. Put ice in a plastic bag. Place a towel between your skin and the bag. Leave the ice on for 20 minutes, 2-3 times a day. If told, apply heat to the affected area before you exercise or as often as told by your provider. Use the heat source that your provider recommends, such as a moist heat pack or a heating pad. Place a towel between your skin and the heat source. Leave the heat on for 20-30 minutes. If your skin turns bright red, remove the ice or heat right away to prevent skin damage. The risk of damage is higher if you cannot feel pain, heat, or cold. Activity Do not drive while wearing a cervical collar. If you do not have a cervical collar, ask if it is safe to drive while your neck heals. Do not lift anything that is heavier than 10 lb (4.5 kg) until your provider says that it is safe. Rest as told by your provider. Avoid positions and activities that make your symptoms worse. Do physical therapy exercises as told by your provider or physical therapist. Return to your normal activities as told by your provider. Ask your provider what activities are safe for you. General instructions Do not use any products that contain nicotine or tobacco. These products include cigarettes, chewing tobacco, and vaping devices, such as e-cigarettes. These can delay healing. If you need help quitting, ask your provider. Keep all follow-up visits. Your provider will monitor your injury and activity level. How is this prevented? To prevent a cervical sprain from happening again: Use and maintain good posture. Make any needed adjustments to your workstation to help you do this. Exercise regularly as told by your provider or physical therapist. Avoid risky activities that may cause a cervical sprain. Contact a health care provider  if: You have symptoms that get worse or do not get better after 2 weeks of treatment. You have new symptoms. Your pain gets worse or does not get better with medicine. You have sores or irritated skin on your neck from wearing your cervical collar. Get help right away if: You have severe pain. You develop numbness, tingling, or weakness in any part of your body. You cannot move a part of your body (you have paralysis). You have neck pain along with severe dizziness or headache. This information is not intended to replace advice given to you by your health care provider. Make sure you discuss any questions you have with your health care provider. Document Revised: 06/15/2022 Document Reviewed: 06/15/2022 Elsevier Patient Education  [place].

## 2023-02-22 NOTE — Progress Notes (Signed)
Virtual Visit Consent   Diane Snyder, you are scheduled for a virtual visit with a [facility] provider today. Just as with appointments in the office, your consent must be obtained to participate. Your consent will be active for this visit and any virtual visit you may have with one of our providers in the next 365 days. If you have a MyChart account, a copy of this consent can be sent to you electronically.  As this is a virtual visit, video technology does not allow for your provider to perform a traditional examination. This may limit your provider's ability to fully assess your condition. If your provider identifies any concerns that need to be evaluated in person or the need to arrange testing (such as labs, EKG, etc.), we will make arrangements to do so. Although advances in technology are sophisticated, we cannot ensure that it will always work on either your end or our end. If the connection with a video visit is poor, the visit may have to be switched to a telephone visit. With either a video or telephone visit, we are not always able to ensure that we have a secure connection.  By engaging in this virtual visit, you consent to the provision of healthcare and authorize for your insurance to be billed (if applicable) for the services provided during this visit. Depending on your insurance coverage, you may receive a charge related to this service.  I need to obtain your verbal consent now. Are you willing to proceed with your visit today? Diane Snyder has provided verbal consent on 02/22/2023 for a virtual visit (video or telephone). Dellia Nims, FNP  Date: 02/22/2023 5:19 PM  Virtual Visit via Video Note   I, Dellia Nims, connected with  Diane Snyder  ([id number], Sep 17, 1981) on 02/22/23 at  5:15 PM EDT by a video-enabled telemedicine application and verified that I am speaking with the correct person using two identifiers.  Location: Patient: Virtual Visit Location Patient:  Home Provider: Virtual Visit Location Provider: Home Office   I discussed the limitations of evaluation and management by telemedicine and the availability of in person appointments. The patient expressed understanding and agreed to proceed.    History of Present Illness: Diane Snyder is a 42 y.o. who identifies as a female who was assigned female at birth, and is being seen today for left sided neck pain through trapezius region with pain around left scapula. NKI. No surgeries.She is applying ice. Reports pain started yesterday.   HPI: HPI  Problems:  Patient Active Problem List   Diagnosis Date Noted   Vaginal delivery 10/02/2020   Borderline hypertension 08/23/2020   Diabetes mellitus affecting pregnancy 04/10/2020   Supervision of high-risk pregnancy 03/30/2020   AMA (advanced maternal age) multigravida 35+ 03/30/2020   Maternal obesity affecting pregnancy, antepartum 03/30/2020   History of gestational hypertension 03/30/2020   Chest pain 12/03/2017    Allergies:  Allergies  Allergen Reactions   Contrast Media [Iodinated Contrast Media] Other (See Comments)    Pt states she blacked out in the machine   Medications: No current outpatient medications on file.  Observations/Objective: Patient is well-developed, well-nourished in no acute distress.  Resting comfortably  at home.  Head is normocephalic, atraumatic.  No labored breathing.  Speech is clear and coherent with logical content.  Patient is alert and oriented at baseline.  Sitting with ice on her neck  Assessment and Plan: 1. Neck pain  Ice or heat, no ibuprofen or aleve  while on naprosyn, med use and side effects discussed, UC if sx persist or worsen  Follow Up Instructions: I discussed the assessment and treatment plan with the patient. The patient was provided an opportunity to ask questions and all were answered. The patient agreed with the plan and demonstrated an understanding of the instructions.  A  copy of instructions were sent to the patient via MyChart unless otherwise noted below.     The patient was advised to call back or seek an in-person evaluation if the symptoms worsen or if the condition fails to improve as anticipated.  Time:  I spent 10 minutes with the patient via telehealth technology discussing the above problems/concerns.    Dellia Nims, FNP

## 2023-02-27 ENCOUNTER — Ambulatory Visit
Admission: RE | Admit: 2023-02-27 | Discharge: 2023-02-27 | Disposition: A | Discharge status: Home / Self Care | Payer: Medicaid Other | Source: Ambulatory Visit | Attending: Family Medicine | Admitting: Family Medicine

## 2023-02-27 VITALS — BP 120/84 | HR 101 | Temp 98.2°F | Resp 16 | Ht 66.0 in | Wt 218.0 lb

## 2023-02-27 DIAGNOSIS — S46812A Strain of other muscles, fascia and tendons at shoulder and upper arm level, left arm, initial encounter: Secondary | ICD-10-CM

## 2023-02-27 DIAGNOSIS — S29012A Strain of muscle and tendon of back wall of thorax, initial encounter: Secondary | ICD-10-CM

## 2023-02-27 MED ORDER — METHYLPREDNISOLONE 4 MG PO TBPK: ORAL_TABLET | ORAL | 0 refills | Status: DC

## 2023-02-27 MED ORDER — BACLOFEN 10 MG PO TABS: 10.0000 mg | ORAL_TABLET | Freq: Three times a day (TID) | ORAL | 0 refills | Status: DC

## 2023-02-27 NOTE — ED Provider Notes (Signed)
[facility]    CSN: [id number] Arrival date & time: 02/27/23  0856      History   Chief Complaint Chief Complaint  Patient presents with   Shoulder Pain    HPI Diane Snyder is a 42 y.o. female.   HPI  Patient has pain in her left shoulder for 4 days.  She had a video visit and was given naproxen and Flexeril.  These have not helped her at all.  It is present in the left neck, upper back, and shoulder region.  She has some sensation of pain and weakness into her arm.  No numbness or tingling.  No loss of dexterity or use of arm.  She has had no trauma or accident.  No fall.  She woke up with this pain.  She works in [employer] so states she does do a lot of lifting pushing and pulling.  Her problem is in the left side.  She is right-handed.  Past Medical History:  Diagnosis Date   Diverticulosis    GDM (gestational diabetes mellitus) 03/2020   History of gestational hypertension     Patient Active Problem List   Diagnosis Date Noted   Vaginal delivery 10/02/2020   Borderline hypertension 08/23/2020   Diabetes mellitus affecting pregnancy 04/10/2020   Supervision of high-risk pregnancy 03/30/2020   AMA (advanced maternal age) multigravida 35+ 03/30/2020   Maternal obesity affecting pregnancy, antepartum 03/30/2020   History of gestational hypertension 03/30/2020   Chest pain 12/03/2017    Past Surgical History:  Procedure Laterality Date   CHOLECYSTECTOMY      OB History     Gravida  3   Para  3   Term  3   Preterm      AB      Living  3      SAB      IAB      Ectopic      Multiple  0   Live Births  3            Home Medications    Prior to Admission medications   Medication Sig Start Date End Date Taking? Authorizing Provider  baclofen (LIORESAL) 10 MG tablet Take 1 tablet (10 mg total) by mouth 3 (three) times daily. 02/27/23  Yes Raylene Everts, MD  methylPREDNISolone (MEDROL DOSEPAK) 4 MG TBPK tablet tad  02/27/23  Yes Raylene Everts, MD    Family History Family History  Problem Relation Age of Onset   Hypertension Mother    Heart disease Maternal Grandmother    Hypertension Maternal Grandmother    Diabetes Maternal Grandmother    Stroke Maternal Grandfather    Heart disease Maternal Grandfather    Heart attack Maternal Grandfather     Social History Social History   Tobacco Use   Smoking status: Former   Smokeless tobacco: Never  Scientific laboratory technician Use: Never used  Substance Use Topics   Alcohol use: Not Currently   Drug use: No     Allergies   Contrast media [iodinated contrast media]   Review of Systems Review of Systems See HPI  Physical Exam Triage Vital Signs ED Triage Vitals  Enc Vitals Group     BP 02/27/23 0912 120/84     Pulse Rate 02/27/23 0912 (!) 101     Resp 02/27/23 0912 16     Temp 02/27/23 0912 98.2 F (36.8 C)     Temp Source 02/27/23 0912 Oral  SpO2 02/27/23 0912 100 %     Weight 02/27/23 0915 218 lb (98.9 kg)     Height 02/27/23 0915 5\' 6"  (1.676 m)     Head Circumference --      Peak Flow --      Pain Score 02/27/23 0914 5     Pain Loc --      Pain Edu? --      Excl. in [place]? --    No data found.  Updated Vital Signs BP 120/84 (BP Location: Left Arm)   Pulse (!) 101   Temp 98.2 F (36.8 C) (Oral)   Resp 16   Ht 5\' 6"  (1.676 m)   Wt 98.9 kg   SpO2 100%   BMI 35.19 kg/m      Physical Exam Constitutional:      General: She is not in acute distress.    Appearance: She is well-developed. She is obese.  HENT:     Head: Normocephalic and atraumatic.  Eyes:     Conjunctiva/sclera: Conjunctivae normal.     Pupils: Pupils are equal, round, and reactive to light.  Neck:   Cardiovascular:     Rate and Rhythm: Normal rate.  Pulmonary:     Effort: Pulmonary effort is normal. No respiratory distress.  Abdominal:     General: There is no distension.     Palpations: Abdomen is soft.  Musculoskeletal:        General:  Normal range of motion.     Cervical back: Normal range of motion. Tenderness present.  Skin:    General: Skin is warm and dry.  Neurological:     General: No focal deficit present.     Mental Status: She is alert.     Sensory: No sensory deficit.     Motor: No weakness.     Coordination: Coordination normal.     Deep Tendon Reflexes: Reflexes normal.      UC Treatments / Results  Labs (all labs ordered are listed, but only abnormal results are displayed) Labs Reviewed - No data to display  EKG   Radiology No results found.  Procedures Procedures (including critical care time)  Medications Ordered in UC Medications - No data to display  Initial Impression / Assessment and Plan / UC Course  I have reviewed the triage vital signs and the nursing notes.  Pertinent labs & imaging results that were available during my care of the patient were reviewed by me and considered in my medical decision making (see chart for details).     Muscular neck pain in the trapezius and somewhat rhomboid region.  Will treat conservatively with steroids and a different muscle relaxer, ice and heat.  Should see Dr. Dianah Field if she fails to improve.  Recommend gentle stretching twice a day.  If taken out of work for 2 days Final Clinical Impressions(s) / UC Diagnoses   Final diagnoses:  Trapezius muscle strain, left, initial encounter  Strain of left rhomboid muscle     Discharge Instructions      Use ice or heat to painful muscles Gentle stretching twice a day Take the Dosepak as directed.  This is a steroid medicine, strong anti-inflammatory Take baclofen 3 times a day.  This is a muscle relaxer. See sports medicine if you fail to improve   ED Prescriptions     Medication Sig Dispense Auth. Provider   methylPREDNISolone (MEDROL DOSEPAK) 4 MG TBPK tablet tad 21 tablet Raylene Everts, MD   baclofen (  LIORESAL) 10 MG tablet Take 1 tablet (10 mg total) by mouth 3 (three)  times daily. 30 each Raylene Everts, MD      PDMP not reviewed this encounter.   Raylene Everts, MD 02/27/23 1016

## 2023-02-27 NOTE — ED Triage Notes (Signed)
Left shoulder pain x 4 days  Pt denies injury  Pt had a video visit on 02/22/23 & was prescribed muscle relaxer  Continues to have  muscle pain - feels like her nerves are being squeezed  Muscle relaxer & naprosyn  at 0830- min relief

## 2023-02-27 NOTE — Discharge Instructions (Signed)
Use ice or heat to painful muscles Gentle stretching twice a day Take the Dosepak as directed.  This is a steroid medicine, strong anti-inflammatory Take baclofen 3 times a day.  This is a muscle relaxer. See sports medicine if you fail to improve

## 2023-02-28 ENCOUNTER — Telehealth: Payer: Self-pay | Admitting: Emergency Medicine

## 2023-02-28 NOTE — Telephone Encounter (Signed)
Spoke with patient states that she is feeling somewhat better.  Patient has started the muscle relaxer and other meds given.  Will follow up as needed.

## 2023-10-02 DIAGNOSIS — R7302 Impaired glucose tolerance (oral): Secondary | ICD-10-CM

## 2023-10-02 DIAGNOSIS — Z0100 Encounter for examination of eyes and vision without abnormal findings: Secondary | ICD-10-CM

## 2023-10-15 ENCOUNTER — Ambulatory Visit (INDEPENDENT_AMBULATORY_CARE_PROVIDER_SITE_OTHER): Payer: Medicaid Other | Admitting: Family Medicine

## 2023-10-15 ENCOUNTER — Encounter: Payer: Self-pay | Admitting: Family Medicine

## 2023-10-15 VITALS — BP 117/76 | HR 81 | Temp 97.6°F | Resp 18 | Ht 66.0 in | Wt 209.6 lb

## 2023-10-15 DIAGNOSIS — Z0000 Encounter for general adult medical examination without abnormal findings: Secondary | ICD-10-CM

## 2023-10-15 DIAGNOSIS — Z136 Encounter for screening for cardiovascular disorders: Secondary | ICD-10-CM

## 2023-10-15 DIAGNOSIS — Z13220 Encounter for screening for lipoid disorders: Secondary | ICD-10-CM

## 2023-10-15 DIAGNOSIS — R7302 Impaired glucose tolerance (oral): Secondary | ICD-10-CM

## 2023-10-15 DIAGNOSIS — Z23 Encounter for immunization: Secondary | ICD-10-CM | POA: Diagnosis not present

## 2023-10-15 NOTE — Progress Notes (Signed)
Complete physical exam  Patient: Diane Snyder   DOB: [birth year]   42 y.o. Female  MRN: [id number]  Subjective:    Chief Complaint  Patient presents with   Annual Exam    Diane Snyder is a 42 y.o. female who presents today for a complete physical exam. She reports consuming a general diet. The patient does not participate in regular exercise at present. She generally feels well. She reports sleeping well. She does not have additional problems to discuss today.   She would like flu vaccine. Most recent fall risk assessment:    04/22/2020    3:24 PM  Fall Risk   Falls in the past year? 0     Most recent depression screenings:    12/24/2022    8:56 AM 04/22/2020    3:24 PM  PHQ 2/9 Scores  PHQ - 2 Score 0 0  PHQ- 9 Score 1     Vision:Not within last year   Patient Active Problem List   Diagnosis Date Noted   Vaginal delivery 10/02/2020   Borderline hypertension 08/23/2020   Supervision of high-risk pregnancy 03/30/2020   AMA (advanced maternal age) multigravida 35+ 03/30/2020   Maternal obesity affecting pregnancy, antepartum 03/30/2020   History of gestational hypertension 03/30/2020   Chest pain 12/03/2017   Past Medical History:  Diagnosis Date   Diverticulosis    GDM (gestational diabetes mellitus) 03/2020   History of gestational hypertension    Past Surgical History:  Procedure Laterality Date   CHOLECYSTECTOMY     Social History   Socioeconomic History   Marital status: Single    Spouse name: Not on file   Number of children: Not on file   Years of education: Not on file   Highest education level: Not on file  Occupational History   Not on file  Tobacco Use   Smoking status: Former   Smokeless tobacco: Never  Vaping Use   Vaping status: Never Used  Substance and Sexual Activity   Alcohol use: Not Currently   Drug use: No   Sexual activity: Yes    Birth control/protection: None  Other Topics Concern   Not on file  Social History  Narrative   Not on file   Social Determinants of Health   Financial Resource Strain: Not on file  Food Insecurity: Not on file  Transportation Needs: Not on file  Physical Activity: Not on file  Stress: Not on file  Social Connections: Unknown (04/09/2022)   Received from [facility], [facility]   Social Network    Social Network: Not on file  Intimate Partner Violence: Unknown (03/01/2022)   Received from [employer], [facility]   HITS    Physically Hurt: Not on file    Insult or Talk Down To: Not on file    Threaten Physical Harm: Not on file    Scream or Curse: Not on file   Family Status  Relation Name Status   Father  Alive   Mother  Alive   MGM  Alive   MGF  Deceased   PGM  Deceased   PGF  Deceased  No partnership data on file   Allergies  Allergen Reactions   Contrast Media [Iodinated Contrast Media] Other (See Comments)    Pt states she blacked out in the machine      Patient Care Team: Suzan Slick, MD as PCP - General (Family Medicine)   Outpatient Medications Prior to Visit  Medication Sig   [  DISCONTINUED] baclofen (LIORESAL) 10 MG tablet Take 1 tablet (10 mg total) by mouth 3 (three) times daily.   [DISCONTINUED] methylPREDNISolone (MEDROL DOSEPAK) 4 MG TBPK tablet tad   No facility-administered medications prior to visit.    Review of Systems  All other systems reviewed and are negative.        Objective:     BP 117/76   Pulse 81   Temp 97.6 F (36.4 C) (Oral)   Resp 18   Ht 5\' 6"  (1.676 m)   Wt 209 lb 9.6 oz (95.1 kg)   SpO2 99%   BMI 33.83 kg/m  BP Readings from Last 3 Encounters:  10/15/23 117/76  02/27/23 120/84  12/24/22 120/80      Physical Exam Vitals and nursing note reviewed.  Constitutional:      Appearance: Normal appearance. She is normal weight.  HENT:     Head: Normocephalic and atraumatic.     Right Ear: Tympanic membrane, ear canal and external ear normal.     Left Ear: Tympanic membrane, ear  canal and external ear normal.     Nose: Nose normal.     Mouth/Throat:     Mouth: Mucous membranes are moist.     Pharynx: Oropharynx is clear.  Eyes:     Conjunctiva/sclera: Conjunctivae normal.     Pupils: Pupils are equal, round, and reactive to light.  Cardiovascular:     Rate and Rhythm: Normal rate and regular rhythm.     Pulses: Normal pulses.     Heart sounds: Normal heart sounds.  Pulmonary:     Effort: Pulmonary effort is normal.     Breath sounds: Normal breath sounds.  Abdominal:     General: Abdomen is flat. Bowel sounds are normal.  Skin:    General: Skin is warm.     Capillary Refill: Capillary refill takes less than 2 seconds.  Neurological:     General: No focal deficit present.     Mental Status: She is alert and oriented to person, place, and time. Mental status is at baseline.  Psychiatric:        Mood and Affect: Mood normal.        Behavior: Behavior normal.        Thought Content: Thought content normal.        Judgment: Judgment normal.     No results found for any visits on 10/15/23. Last CBC Lab Results  Component Value Date   WBC 6.9 12/24/2022   HGB 12.1 12/24/2022   HCT 37.6 12/24/2022   MCV 79 12/24/2022   MCH 25.4 (L) 12/24/2022   RDW 13.2 12/24/2022   PLT 269 12/24/2022   Last metabolic panel Lab Results  Component Value Date   GLUCOSE 98 12/24/2022   NA 141 12/24/2022   K 4.5 12/24/2022   CL 105 12/24/2022   CO2 21 12/24/2022   BUN 8 12/24/2022   CREATININE 0.73 12/24/2022   EGFR 107 12/24/2022   CALCIUM 9.2 12/24/2022   PROT 7.0 12/24/2022   ALBUMIN 4.3 12/24/2022   LABGLOB 2.7 12/24/2022   AGRATIO 1.6 12/24/2022   BILITOT 0.5 12/24/2022   ALKPHOS 89 12/24/2022   AST 13 12/24/2022   ALT 10 12/24/2022   ANIONGAP 13 10/01/2020   Last lipids Lab Results  Component Value Date   CHOL 166 12/24/2022   HDL 50 12/24/2022   LDLCALC 106 (H) 12/24/2022   TRIG 51 12/24/2022   CHOLHDL 3.3 12/24/2022   Last hemoglobin  A1c Lab Results  Component Value Date   HGBA1C 6.4 (H) 12/24/2022        Assessment & Plan:    Routine Health Maintenance and Physical Exam  Immunization History  Administered Date(s) Administered   Influenza,inj,Quad PF,6+ Mos 09/01/2020   PPD Test 08/13/2022   Tdap 07/20/2020    Health Maintenance  Topic Date Due   Cervical Cancer Screening (HPV/Pap Cotest)  02/02/2025   DTaP/Tdap/Td (2 - Td or Tdap) 07/20/2030   Hepatitis C Screening  Completed   HIV Screening  Completed   HPV VACCINES  Aged Out   INFLUENZA VACCINE  Discontinued   COVID-19 Vaccine  Discontinued    Discussed health benefits of physical activity, and encouraged her to engage in regular exercise appropriate for her age and condition.  Problem List Items Addressed This Visit   None  No follow-ups on file. Annual physical exam  Encounter for lipid screening for cardiovascular disease -     Lipid panel  Impaired glucose tolerance -     CBC with Differential/Platelet -     Comprehensive metabolic panel -     Hemoglobin A1c  Need for influenza vaccination -     Flu vaccine trivalent PF, 6mos and older(Flulaval,Afluria,Fluarix,Fluzone)   Screening labs Flu vaccine See in 6 months sooner prn.    Suzan Slick, MD

## 2023-10-16 ENCOUNTER — Encounter: Payer: Self-pay | Admitting: Family Medicine

## 2023-10-16 DIAGNOSIS — R7302 Impaired glucose tolerance (oral): Secondary | ICD-10-CM

## 2023-10-16 LAB — COMPREHENSIVE METABOLIC PANEL
ALT: 9 [IU]/L (ref 0–32)
AST: 12 [IU]/L (ref 0–40)
Albumin: 4.3 g/dL (ref 3.9–4.9)
Alkaline Phosphatase: 96 [IU]/L (ref 44–121)
BUN/Creatinine Ratio: 8 — ABNORMAL LOW (ref 9–23)
BUN: 7 mg/dL (ref 6–24)
Bilirubin Total: 0.5 mg/dL (ref 0.0–1.2)
CO2: 20 mmol/L (ref 20–29)
Calcium: 9.2 mg/dL (ref 8.7–10.2)
Chloride: 105 mmol/L (ref 96–106)
Creatinine, Ser: 0.83 mg/dL (ref 0.57–1.00)
Globulin, Total: 3 g/dL (ref 1.5–4.5)
Glucose: 120 mg/dL — ABNORMAL HIGH (ref 70–99)
Potassium: 4.4 mmol/L (ref 3.5–5.2)
Sodium: 140 mmol/L (ref 134–144)
Total Protein: 7.3 g/dL (ref 6.0–8.5)
eGFR: 91 mL/min/{1.73_m2} (ref >59)

## 2023-10-16 LAB — CBC WITH DIFFERENTIAL/PLATELET
Basophils Absolute: 0 10*3/uL (ref 0.0–0.2)
Basos: 1 %
EOS (ABSOLUTE): 0.3 10*3/uL (ref 0.0–0.4)
Eos: 4 %
Hematocrit: 38.5 % (ref 34.0–46.6)
Hemoglobin: 12.2 g/dL (ref 11.1–15.9)
Immature Grans (Abs): 0 10*3/uL (ref 0.0–0.1)
Immature Granulocytes: 0 %
Lymphocytes Absolute: 1.8 10*3/uL (ref 0.7–3.1)
Lymphs: 29 %
MCH: 25.8 pg — ABNORMAL LOW (ref 26.6–33.0)
MCHC: 31.7 g/dL (ref 31.5–35.7)
MCV: 82 fL (ref 79–97)
Monocytes Absolute: 0.4 10*3/uL (ref 0.1–0.9)
Monocytes: 6 %
Neutrophils Absolute: 3.7 10*3/uL (ref 1.4–7.0)
Neutrophils: 60 %
Platelets: 287 10*3/uL (ref 150–450)
RBC: 4.72 x10E6/uL (ref 3.77–5.28)
RDW: 12.3 % (ref 11.7–15.4)
WBC: 6.2 10*3/uL (ref 3.4–10.8)

## 2023-10-16 LAB — HEMOGLOBIN A1C
Est. average glucose Bld gHb Est-mCnc: 143 mg/dL
Hgb A1c MFr Bld: 6.6 % — ABNORMAL HIGH (ref 4.8–5.6)

## 2023-10-16 LAB — LIPID PANEL
Chol/HDL Ratio: 3.5 ratio (ref 0.0–4.4)
Cholesterol, Total: 175 mg/dL (ref 100–199)
HDL: 50 mg/dL (ref >39)
LDL Chol Calc (NIH): 110 mg/dL — ABNORMAL HIGH (ref 0–99)
Triglycerides: 80 mg/dL (ref 0–149)
VLDL Cholesterol Cal: 15 mg/dL (ref 5–40)

## 2023-10-16 MED ORDER — METFORMIN HCL ER 500 MG PO TB24
500.0000 mg | ORAL_TABLET | Freq: Every day | ORAL | 1 refills | Status: DC
Start: 2023-10-16 — End: 2024-09-23

## 2024-01-14 ENCOUNTER — Encounter: Payer: Self-pay | Admitting: Family Medicine

## 2024-01-20 ENCOUNTER — Ambulatory Visit (INDEPENDENT_AMBULATORY_CARE_PROVIDER_SITE_OTHER): Payer: Medicaid Other | Admitting: Family Medicine

## 2024-01-20 ENCOUNTER — Encounter: Payer: Self-pay | Admitting: Family Medicine

## 2024-01-20 VITALS — BP 108/73 | HR 82 | Temp 97.7°F | Resp 18 | Ht 66.0 in | Wt 211.9 lb

## 2024-01-20 DIAGNOSIS — Z111 Encounter for screening for respiratory tuberculosis: Secondary | ICD-10-CM

## 2024-01-20 NOTE — Progress Notes (Signed)
   Subjective:    Patient ID: Diane Snyder, female    DOB: 04/09/1981, 43 y.o.   MRN: [id number]  HPI Patient is here for PPD placemeny    Review of Systems     Objective:   Physical Exam        Assessment & Plan:   Patient tolerated injection without any concerns, Placement was in Right forearm

## 2024-01-22 ENCOUNTER — Ambulatory Visit (INDEPENDENT_AMBULATORY_CARE_PROVIDER_SITE_OTHER): Payer: Medicaid Other | Admitting: Family Medicine

## 2024-01-22 VITALS — BP 102/70 | HR 84 | Temp 98.6°F | Resp 18 | Ht 66.0 in | Wt 211.4 lb

## 2024-01-22 DIAGNOSIS — Z111 Encounter for screening for respiratory tuberculosis: Secondary | ICD-10-CM

## 2024-01-22 LAB — TB SKIN TEST
Induration: 0 mm
TB Skin Test: NEGATIVE

## 2024-01-22 NOTE — Progress Notes (Signed)
   Subjective:    Patient ID: Diane Snyder, female    DOB: [birth year], 43 y.o.   MRN: [id number]  HPI Patient is here for a PPD reading    Review of Systems     Objective:   Physical Exam        Assessment & Plan:  Patient TB skin test was Negative

## 2024-03-03 ENCOUNTER — Telehealth: Payer: Self-pay

## 2024-03-03 ENCOUNTER — Telehealth: Payer: Self-pay | Admitting: Family Medicine

## 2024-03-03 NOTE — Telephone Encounter (Signed)
 Copied from CRM [id number]. 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 Please call when they are received and let patient know how to proceed.

## 2024-03-03 NOTE — Telephone Encounter (Signed)
 Copied from CRM [id number]. 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 Please call when they are received and let patient know how to proceed. >> Mar 03, 2024  2:51 PM Nyra Capes wrote: Patient calling in returning a message.

## 2024-03-03 NOTE — Telephone Encounter (Signed)
 Called to let the patient know that paperwork was completed. Paperwork was placed in the file for pick up. tvt

## 2024-03-03 NOTE — Telephone Encounter (Signed)
 Form completed. In completed box at nurse's desk. Pt may pick up

## 2024-03-03 NOTE — Telephone Encounter (Signed)
 This has been completed and pt notified to pick up earlier today

## 2024-03-03 NOTE — Telephone Encounter (Signed)
 Form was received and placed in Dr. Verna Czech work box. tvt

## 2024-04-13 ENCOUNTER — Ambulatory Visit: Payer: Medicaid Other | Admitting: Family Medicine

## 2024-08-19 LAB — HM MAMMOGRAPHY

## 2024-09-19 ENCOUNTER — Encounter: Payer: Self-pay | Admitting: Family Medicine

## 2024-09-20 ENCOUNTER — Encounter: Payer: Self-pay | Admitting: Family Medicine

## 2024-09-21 NOTE — Telephone Encounter (Signed)
 Patient scheduled an appointment for 09/23/2024 at 8:50.

## 2024-09-23 ENCOUNTER — Encounter: Payer: Self-pay | Admitting: Family Medicine

## 2024-09-23 ENCOUNTER — Ambulatory Visit (INDEPENDENT_AMBULATORY_CARE_PROVIDER_SITE_OTHER): Admitting: Family Medicine

## 2024-09-23 VITALS — BP 116/74 | HR 75 | Temp 98.7°F | Ht 66.0 in | Wt 200.0 lb

## 2024-09-23 DIAGNOSIS — E119 Type 2 diabetes mellitus without complications: Secondary | ICD-10-CM | POA: Diagnosis not present

## 2024-09-23 DIAGNOSIS — E66811 Obesity, class 1: Secondary | ICD-10-CM | POA: Diagnosis not present

## 2024-09-23 DIAGNOSIS — Z23 Encounter for immunization: Secondary | ICD-10-CM | POA: Diagnosis not present

## 2024-09-23 NOTE — Assessment & Plan Note (Signed)
 Presents to discuss [facility] for weight loss. With history of diabetes, will wait for labs to return.

## 2024-09-23 NOTE — Progress Notes (Signed)
   Established Patient Office Visit  Subjective   Patient ID: Diane Snyder, female    DOB: 11/24/1981  Age: 43 y.o. MRN: [id number]  Chief Complaint  Patient presents with   Follow-up    Metformin  and talk about Wegovy Wants flu shot today    [name] staff recommended she start on Wegovy to help remove visceral fat behind abdomen. Wants to consider Wegovy.  Reports not taking Metformin  due to not feeling myself Felt feeling off balance.         ROS    Objective:     BP 116/74 (BP Location: Left Arm, Patient Position: Sitting, Cuff Size: Large)   Pulse 75   Temp 98.7 F (37.1 C) (Oral)   Ht 5' 6 (1.676 m)   Wt 200 lb (90.7 kg)   LMP  (LMP Unknown)   SpO2 100%   BMI 32.28 kg/m    Physical Exam Vitals and nursing note reviewed.  Constitutional:      General: She is not in acute distress.    Appearance: Normal appearance.  Cardiovascular:     Rate and Rhythm: Normal rate and regular rhythm.     Heart sounds: Normal heart sounds.  Pulmonary:     Effort: Pulmonary effort is normal.     Breath sounds: Normal breath sounds.  Skin:    General: Skin is warm and dry.  Neurological:     General: No focal deficit present.     Mental Status: She is alert. Mental status is at baseline.  Psychiatric:        Mood and Affect: Mood normal.        Behavior: Behavior normal.        Thought Content: Thought content normal.        Judgment: Judgment normal.      No results found for any visits on 09/23/24.    The 10-year ASCVD risk score (Arnett DK, et al., 2019) is: 1%    Assessment & Plan:   Problem List Items Addressed This Visit     Encounter for administration of vaccine   Relevant Orders   Flu vaccine trivalent PF, 6mos and older(Flulaval,Afluria,Fluarix,Fluzone) (Completed)   Obesity (BMI 30.0-34.9) - Primary   Presents to discuss [facility] for weight loss. With history of diabetes, will wait for labs to return.       Relevant Orders    Comprehensive metabolic panel with GFR   Hemoglobin A1c   Type 2 diabetes mellitus without complication, without long-term current use of insulin  (HCC)   Last A1C in Nov 2024: 6.6 Metformin  started per PCP. Stopped taking due to intolerance to metformin . Will recheck A1C today. May benefit from GLP-1 therapy. Follow-up based on results       Relevant Orders   Hemoglobin A1c  Agrees with plan of care discussed.  Questions answered.   Return for based on lab results .    Diane JONELLE Brownie, FNP

## 2024-09-23 NOTE — Assessment & Plan Note (Signed)
 Last A1C in Nov 2024: 6.6 Metformin  started per PCP. Stopped taking due to intolerance to metformin . Will recheck A1C today. May benefit from GLP-1 therapy. Follow-up based on results

## 2024-09-24 ENCOUNTER — Ambulatory Visit: Payer: Self-pay | Admitting: Family Medicine

## 2024-09-24 DIAGNOSIS — R7303 Prediabetes: Secondary | ICD-10-CM

## 2024-09-24 LAB — COMPREHENSIVE METABOLIC PANEL WITH GFR
ALT: 10 IU/L (ref 0–32)
AST: 27 IU/L (ref 0–40)
Albumin: 4.4 g/dL (ref 3.9–4.9)
Alkaline Phosphatase: 94 IU/L (ref 41–116)
BUN/Creatinine Ratio: 12 (ref 9–23)
BUN: 10 mg/dL (ref 6–24)
Bilirubin Total: 0.5 mg/dL (ref 0.0–1.2)
CO2: 18 mmol/L — ABNORMAL LOW (ref 20–29)
Calcium: 9.4 mg/dL (ref 8.7–10.2)
Chloride: 104 mmol/L (ref 96–106)
Creatinine, Ser: 0.82 mg/dL (ref 0.57–1.00)
Globulin, Total: 3.2 g/dL (ref 1.5–4.5)
Glucose: 106 mg/dL — ABNORMAL HIGH (ref 70–99)
Potassium: 4.8 mmol/L (ref 3.5–5.2)
Sodium: 141 mmol/L (ref 134–144)
Total Protein: 7.6 g/dL (ref 6.0–8.5)
eGFR: 92 mL/min/1.73 (ref >59)

## 2024-09-24 LAB — HEMOGLOBIN A1C
Est. average glucose Bld gHb Est-mCnc: 128 mg/dL
Hgb A1c MFr Bld: 6.1 % — ABNORMAL HIGH (ref 4.8–5.6)

## 2024-09-24 MED ORDER — SEMAGLUTIDE(0.25 OR 0.5MG/DOS) 2 MG/3ML ~~LOC~~ SOPN
0.2500 mg | PEN_INJECTOR | SUBCUTANEOUS | 0 refills | Status: AC
Start: 2024-09-24 — End: ?

## 2024-09-29 ENCOUNTER — Telehealth: Payer: Self-pay

## 2024-09-29 NOTE — Telephone Encounter (Signed)
 Prior auth for: [employer] Determination: PENDING Auth #: [id number] Valid from: N/A Reason: N/A  The patient has been notified via a MyChart message.

## 2024-10-02 NOTE — Telephone Encounter (Signed)
 Prior auth for: [facility] Determination: APPROVED Auth #BETHA KASAL / [id number] Valid from: 09/29/24 - 09/29/25 Reason: N/A   The patient has been notified via a MyChart message

## 2024-10-15 ENCOUNTER — Encounter: Admitting: Family Medicine

## 2024-11-30 ENCOUNTER — Ambulatory Visit (INDEPENDENT_AMBULATORY_CARE_PROVIDER_SITE_OTHER): Admitting: Family Medicine

## 2024-11-30 ENCOUNTER — Encounter: Payer: Self-pay | Admitting: Family Medicine

## 2024-11-30 VITALS — BP 113/72 | HR 81 | Ht 66.0 in | Wt 195.0 lb

## 2024-11-30 DIAGNOSIS — Z136 Encounter for screening for cardiovascular disorders: Secondary | ICD-10-CM

## 2024-11-30 DIAGNOSIS — Z0000 Encounter for general adult medical examination without abnormal findings: Secondary | ICD-10-CM

## 2024-11-30 DIAGNOSIS — Z13220 Encounter for screening for lipoid disorders: Secondary | ICD-10-CM | POA: Diagnosis not present

## 2024-11-30 DIAGNOSIS — E119 Type 2 diabetes mellitus without complications: Secondary | ICD-10-CM | POA: Diagnosis not present

## 2024-11-30 DIAGNOSIS — Z23 Encounter for immunization: Secondary | ICD-10-CM

## 2024-11-30 NOTE — Progress Notes (Signed)
 "  Complete physical exam  Patient: Diane Snyder   DOB: 04-Oct-1981   44 y.o. Female  MRN: [id number]  Subjective:    Chief Complaint  Patient presents with   Annual Exam    Ozempic  - last dose tomorrow , 0.25 Last A1c - 09/23/2024 - 6.1    Diane Snyder is a 44 y.o. female who presents today for a complete physical exam. She reports consuming a general diet. The patient does not participate in regular exercise at present. She generally feels fairly well. She reports sleeping well. She does not have additional problems to discuss today.  Would like PCV-20 today.  Most recent fall risk assessment:    09/23/2024    8:52 AM  Fall Risk   Falls in the past year? 0  Number falls in past yr: 0  Injury with Fall? 0   Risk for fall due to : No Fall Risks  Follow up Falls evaluation completed     Data saved with a previous flowsheet row definition     Most recent depression screenings:    11/30/2024    8:59 AM 09/23/2024    8:52 AM  PHQ 2/9 Scores  PHQ - 2 Score 2 2  PHQ- 9 Score 7 6      Data saved with a previous flowsheet row definition    Vision:Not within last year   Patient Active Problem List   Diagnosis Date Noted   Encounter for administration of vaccine 09/23/2024   Obesity (BMI 30.0-34.9) 09/23/2024   Type 2 diabetes mellitus without complication, without long-term current use of insulin  (HCC) 09/23/2024   Vaginal delivery 10/02/2020   Borderline hypertension 08/23/2020   Supervision of high-risk pregnancy 03/30/2020   AMA (advanced maternal age) multigravida 35+ 03/30/2020   Maternal obesity affecting pregnancy, antepartum 03/30/2020   History of gestational hypertension 03/30/2020   Chest pain 12/03/2017   Past Medical History:  Diagnosis Date   Diverticulosis    GDM (gestational diabetes mellitus) 03/2020   History of gestational hypertension    Past Surgical History:  Procedure Laterality Date   CHOLECYSTECTOMY     Social History    Socioeconomic History   Marital status: Single    Spouse name: Not on file   Number of children: Not on file   Years of education: Not on file   Highest education level: Some college, no degree  Occupational History   Not on file  Tobacco Use   Smoking status: Former   Smokeless tobacco: Never  Vaping Use   Vaping status: Never Used  Substance and Sexual Activity   Alcohol use: Not Currently   Drug use: No   Sexual activity: Yes    Birth control/protection: None  Other Topics Concern   Not on file  Social History Narrative   Not on file   Social Drivers of Health   Tobacco Use: Medium Risk (11/30/2024)   Patient History    Smoking Tobacco Use: Former    Smokeless Tobacco Use: Never    Passive Exposure: Not on Actuary Strain: Low Risk (09/22/2024)   Overall Financial Resource Strain (CARDIA)    Difficulty of Paying Living Expenses: Not hard at all  Food Insecurity: No Food Insecurity (09/22/2024)   Epic    Worried About Radiation Protection Practitioner of Food in the Last Year: Never true    Ran Out of Food in the Last Year: Never true  Transportation Needs: No Transportation Needs (09/22/2024)   Epic  Lack of Transportation (Medical): No    Lack of Transportation (Non-Medical): No  Physical Activity: Inactive (09/22/2024)   Exercise Vital Sign    Days of Exercise per Week: 0 days    Minutes of Exercise per Session: Not on file  Stress: Stress Concern Present (09/22/2024)   [employer] of Occupational Health - Occupational Stress Questionnaire    Feeling of Stress: To some extent  Social Connections: Moderately Integrated (09/22/2024)   Social Connection and Isolation Panel    Frequency of Communication with Friends and Family: Three times a week    Frequency of Social Gatherings with Friends and Family: Once a week    Attends Religious Services: More than 4 times per year    Active Member of Clubs or Organizations: No    Attends Hospital Doctor: Not on file    Marital Status: Married  Catering Manager Violence: Not on file  Depression (PHQ2-9): Medium Risk (11/30/2024)   Depression (PHQ2-9)    PHQ-2 Score: 7  Alcohol Screen: Low Risk (09/22/2024)   Alcohol Screen    Last Alcohol Screening Score (AUDIT): 1  Housing: High Risk (09/22/2024)   Epic    Unable to Pay for Housing in the Last Year: Yes    Number of Times Moved in the Last Year: 0    Homeless in the Last Year: No  Utilities: Not on file  Health Literacy: Not on file   Allergies[1]    Patient Care Team: Colette Torrence GRADE, MD as PCP - General (Family Medicine)   Show/hide medication list[2]  Review of Systems  All other systems reviewed and are negative.         Objective:     BP 113/72 (BP Location: Left Arm, Patient Position: Sitting, Cuff Size: Normal)   Pulse 81   Ht 5' 6 (1.676 m)   Wt 195 lb (88.5 kg)   SpO2 100%   BMI 31.47 kg/m     Physical Exam Vitals and nursing note reviewed.  Constitutional:      Appearance: Normal appearance. She is normal weight.  HENT:     Head: Normocephalic and atraumatic.     Right Ear: External ear normal.     Left Ear: External ear normal.     Nose: Nose normal.     Mouth/Throat:     Mouth: Mucous membranes are moist.     Pharynx: Oropharynx is clear.  Eyes:     Conjunctiva/sclera: Conjunctivae normal.     Pupils: Pupils are equal, round, and reactive to light.  Cardiovascular:     Rate and Rhythm: Normal rate and regular rhythm.     Pulses: Normal pulses.     Heart sounds: Normal heart sounds.  Pulmonary:     Effort: Pulmonary effort is normal.     Breath sounds: Normal breath sounds.  Abdominal:     General: Abdomen is flat. Bowel sounds are normal.  Skin:    General: Skin is warm.     Capillary Refill: Capillary refill takes less than 2 seconds.  Neurological:     General: No focal deficit present.     Mental Status: She is alert and oriented to person, place, and time. Mental  status is at baseline.  Psychiatric:        Mood and Affect: Mood normal.        Behavior: Behavior normal.        Thought Content: Thought content normal.  Judgment: Judgment normal.      Results for orders placed or performed in visit on 11/30/24  HM MAMMOGRAPHY  Result Value Ref Range   HM Mammogram 0-4 Bi-Rad 0-4 Bi-Rad, Self Reported Normal   Last CBC Lab Results  Component Value Date   WBC 6.2 10/15/2023   HGB 12.2 10/15/2023   HCT 38.5 10/15/2023   MCV 82 10/15/2023   MCH 25.8 (L) 10/15/2023   RDW 12.3 10/15/2023   PLT 287 10/15/2023   Last metabolic panel Lab Results  Component Value Date   GLUCOSE 106 (H) 09/23/2024   NA 141 09/23/2024   K 4.8 09/23/2024   CL 104 09/23/2024   CO2 18 (L) 09/23/2024   BUN 10 09/23/2024   CREATININE 0.82 09/23/2024   EGFR 92 09/23/2024   CALCIUM 9.4 09/23/2024   PROT 7.6 09/23/2024   ALBUMIN 4.4 09/23/2024   LABGLOB 3.2 09/23/2024   AGRATIO 1.6 12/24/2022   BILITOT 0.5 09/23/2024   ALKPHOS 94 09/23/2024   AST 27 09/23/2024   ALT 10 09/23/2024   ANIONGAP 13 10/01/2020   Last lipids Lab Results  Component Value Date   CHOL 175 10/15/2023   HDL 50 10/15/2023   LDLCALC 110 (H) 10/15/2023   TRIG 80 10/15/2023   CHOLHDL 3.5 10/15/2023   Last hemoglobin A1c Lab Results  Component Value Date   HGBA1C 6.1 (H) 09/23/2024        Assessment & Plan:    Routine Health Maintenance and Physical Exam  Immunization History  Administered Date(s) Administered   Influenza, Seasonal, Injecte, Preservative Fre 10/15/2023, 09/23/2024   Influenza,inj,Quad PF,6+ Mos 09/01/2020   PNEUMOCOCCAL CONJUGATE-20 11/30/2024   PPD Test 08/13/2022, 01/20/2024   Tdap 07/20/2020    Health Maintenance  Topic Date Due   OPHTHALMOLOGY EXAM  Never done   Diabetic kidney evaluation - Urine ACR  Never done   Hepatitis B Vaccines 19-59 Average Risk (1 of 3 - 19+ 3-dose series) Never done   HPV VACCINES (1 - 3-dose SCDM series) Never  done   Cervical Cancer Screening (HPV/Pap Cotest)  02/02/2025   HEMOGLOBIN A1C  03/24/2025   Diabetic kidney evaluation - eGFR measurement  09/23/2025   FOOT EXAM  11/30/2025   Mammogram  08/19/2026   DTaP/Tdap/Td (2 - Td or Tdap) 07/20/2030   Pneumococcal Vaccine  Completed   Hepatitis C Screening  Completed   HIV Screening  Completed   Meningococcal B Vaccine  Aged Out   Influenza Vaccine  Discontinued   COVID-19 Vaccine  Discontinued    Discussed health benefits of physical activity, and encouraged her to engage in regular exercise appropriate for her age and condition.  Problem List Items Addressed This Visit       Endocrine   Type 2 diabetes mellitus without complication, without long-term current use of insulin  (HCC)   Relevant Orders   CBC with Differential/Platelet   Comprehensive metabolic panel with GFR   Microalbumin / creatinine urine ratio   Other Visit Diagnoses       Annual physical exam    -  Primary     Encounter for lipid screening for cardiovascular disease       Relevant Orders   Lipid panel     Need for vaccination against Streptococcus pneumoniae       Relevant Orders   Pneumococcal conjugate vaccine 20-valent (Prevnar 20) (Completed)      Return in about 6 months (around 05/30/2025) for Chronic condition follow up. Screening labs PCV-20  today    Torrence CINDERELLA Barrier, MD      [1]  Allergies Allergen Reactions   Contrast Media [Iodinated Contrast Media] Other (See Comments)    Pt states she blacked out in the machine  [2]  Outpatient Medications Prior to Visit  Medication Sig   Semaglutide ,0.25 or 0.5MG /DOS, 2 MG/3ML SOPN Inject 0.25 mg into the skin once a week.   No facility-administered medications prior to visit.   "

## 2024-12-01 LAB — CBC WITH DIFFERENTIAL/PLATELET
Basophils Absolute: 0 x10E3/uL (ref 0.0–0.2)
Basos: 1 %
EOS (ABSOLUTE): 0.3 x10E3/uL (ref 0.0–0.4)
Eos: 5 %
Hematocrit: 42 % (ref 34.0–46.6)
Hemoglobin: 13.1 g/dL (ref 11.1–15.9)
Immature Grans (Abs): 0 x10E3/uL (ref 0.0–0.1)
Immature Granulocytes: 0 %
Lymphocytes Absolute: 1.9 x10E3/uL (ref 0.7–3.1)
Lymphs: 39 %
MCH: 26 pg — ABNORMAL LOW (ref 26.6–33.0)
MCHC: 31.2 g/dL — ABNORMAL LOW (ref 31.5–35.7)
MCV: 84 fL (ref 79–97)
Monocytes Absolute: 0.3 x10E3/uL (ref 0.1–0.9)
Monocytes: 7 %
Neutrophils Absolute: 2.3 x10E3/uL (ref 1.4–7.0)
Neutrophils: 48 %
Platelets: 300 x10E3/uL (ref 150–450)
RBC: 5.03 x10E6/uL (ref 3.77–5.28)
RDW: 13.2 % (ref 11.7–15.4)
WBC: 4.8 x10E3/uL (ref 3.4–10.8)

## 2024-12-01 LAB — MICROALBUMIN / CREATININE URINE RATIO
Creatinine, Urine: 357.5 mg/dL
Microalb/Creat Ratio: 4 mg/g{creat} (ref 0–29)
Microalbumin, Urine: 14.2 ug/mL

## 2024-12-01 LAB — COMPREHENSIVE METABOLIC PANEL WITH GFR
ALT: 11 IU/L (ref 0–32)
AST: 16 IU/L (ref 0–40)
Albumin: 4.4 g/dL (ref 3.9–4.9)
Alkaline Phosphatase: 96 IU/L (ref 41–116)
BUN/Creatinine Ratio: 9 (ref 9–23)
BUN: 8 mg/dL (ref 6–24)
Bilirubin Total: 0.5 mg/dL (ref 0.0–1.2)
CO2: 23 mmol/L (ref 20–29)
Calcium: 9.8 mg/dL (ref 8.7–10.2)
Chloride: 104 mmol/L (ref 96–106)
Creatinine, Ser: 0.93 mg/dL (ref 0.57–1.00)
Globulin, Total: 3 g/dL (ref 1.5–4.5)
Glucose: 90 mg/dL (ref 70–99)
Potassium: 4.3 mmol/L (ref 3.5–5.2)
Sodium: 140 mmol/L (ref 134–144)
Total Protein: 7.4 g/dL (ref 6.0–8.5)
eGFR: 79 mL/min/1.73

## 2024-12-01 LAB — LIPID PANEL
Chol/HDL Ratio: 3.5 ratio (ref 0.0–4.4)
Cholesterol, Total: 190 mg/dL (ref 100–199)
HDL: 54 mg/dL
LDL Chol Calc (NIH): 121 mg/dL — ABNORMAL HIGH (ref 0–99)
Triglycerides: 81 mg/dL (ref 0–149)
VLDL Cholesterol Cal: 15 mg/dL (ref 5–40)

## 2024-12-02 ENCOUNTER — Ambulatory Visit: Payer: Self-pay | Admitting: Family Medicine

## 2024-12-02 DIAGNOSIS — E782 Mixed hyperlipidemia: Secondary | ICD-10-CM

## 2024-12-07 MED ORDER — ATORVASTATIN CALCIUM 10 MG PO TABS: 10.0000 mg | ORAL_TABLET | Freq: Every evening | ORAL | 3 refills | Status: AC

## 2025-05-31 ENCOUNTER — Ambulatory Visit: Admitting: Family Medicine
